# Patient Record
Sex: Female | Born: 1945 | Race: White | Hispanic: No | Marital: Married | Smoking: Never smoker
Health system: Southern US, Community
[De-identification: ages and names within clinical notes are randomized; demographics above are authoritative.]

## PROBLEM LIST (undated history)

## (undated) DIAGNOSIS — J329 Chronic sinusitis, unspecified: Secondary | ICD-10-CM

## (undated) DIAGNOSIS — I10 Essential (primary) hypertension: Secondary | ICD-10-CM

## (undated) DIAGNOSIS — R42 Dizziness and giddiness: Secondary | ICD-10-CM

## (undated) DIAGNOSIS — E039 Hypothyroidism, unspecified: Secondary | ICD-10-CM

## (undated) DIAGNOSIS — G20A1 Parkinson's disease without dyskinesia, without mention of fluctuations: Secondary | ICD-10-CM

## (undated) DIAGNOSIS — G20 Parkinson's disease: Secondary | ICD-10-CM

## (undated) HISTORY — DX: Dizziness and giddiness: R42

## (undated) HISTORY — DX: Chronic sinusitis, unspecified: J32.9

---

## 1974-02-17 HISTORY — PX: THYROIDECTOMY: SHX17

## 1986-02-17 HISTORY — PX: TUBAL LIGATION: SHX77

## 2003-02-18 HISTORY — PX: GALLBLADDER SURGERY: SHX652

## 2003-07-23 ENCOUNTER — Other Ambulatory Visit: Payer: Self-pay

## 2005-10-01 ENCOUNTER — Ambulatory Visit: Payer: Self-pay | Admitting: Unknown Physician Specialty

## 2006-04-30 ENCOUNTER — Ambulatory Visit: Payer: Self-pay

## 2007-03-20 ENCOUNTER — Ambulatory Visit: Payer: Self-pay | Admitting: Otolaryngology

## 2007-06-10 ENCOUNTER — Emergency Department: Payer: Self-pay | Admitting: Emergency Medicine

## 2007-06-30 ENCOUNTER — Ambulatory Visit: Payer: Self-pay | Admitting: Unknown Physician Specialty

## 2008-07-01 LAB — HM COLONOSCOPY: HM Colonoscopy: NORMAL

## 2008-08-15 ENCOUNTER — Ambulatory Visit: Payer: Self-pay | Admitting: Unknown Physician Specialty

## 2008-10-12 ENCOUNTER — Other Ambulatory Visit: Payer: Self-pay | Admitting: Unknown Physician Specialty

## 2009-03-27 ENCOUNTER — Ambulatory Visit: Payer: Self-pay | Admitting: Internal Medicine

## 2009-09-05 ENCOUNTER — Other Ambulatory Visit: Payer: Self-pay | Admitting: Unknown Physician Specialty

## 2009-10-18 ENCOUNTER — Ambulatory Visit: Payer: Self-pay

## 2010-10-10 ENCOUNTER — Other Ambulatory Visit: Payer: Self-pay

## 2010-12-23 ENCOUNTER — Ambulatory Visit (INDEPENDENT_AMBULATORY_CARE_PROVIDER_SITE_OTHER): Payer: PRIVATE HEALTH INSURANCE | Admitting: Internal Medicine

## 2010-12-23 ENCOUNTER — Encounter: Payer: Self-pay | Admitting: *Deleted

## 2010-12-23 VITALS — BP 122/56 | HR 64 | Temp 98.5°F | Ht 64.0 in | Wt 194.0 lb

## 2010-12-23 DIAGNOSIS — I10 Essential (primary) hypertension: Secondary | ICD-10-CM

## 2010-12-23 DIAGNOSIS — R42 Dizziness and giddiness: Secondary | ICD-10-CM | POA: Insufficient documentation

## 2010-12-23 DIAGNOSIS — J309 Allergic rhinitis, unspecified: Secondary | ICD-10-CM | POA: Insufficient documentation

## 2010-12-23 DIAGNOSIS — R5383 Other fatigue: Secondary | ICD-10-CM

## 2010-12-23 DIAGNOSIS — E039 Hypothyroidism, unspecified: Secondary | ICD-10-CM

## 2010-12-23 DIAGNOSIS — R5381 Other malaise: Secondary | ICD-10-CM

## 2010-12-23 MED ORDER — ONDANSETRON HCL 8 MG PO TABS: 8.0000 mg | ORAL_TABLET | Freq: Three times a day (TID) | ORAL | Status: AC | PRN

## 2010-12-23 MED ORDER — FEXOFENADINE HCL 180 MG PO TABS: 180.0000 mg | ORAL_TABLET | Freq: Every day | ORAL | Status: DC

## 2010-12-23 MED ORDER — MECLIZINE HCL 25 MG PO TABS: 25.0000 mg | ORAL_TABLET | Freq: Three times a day (TID) | ORAL | Status: AC | PRN

## 2010-12-23 NOTE — Progress Notes (Signed)
Subjective:    Patient ID: Christina [name], female    DOB: [birth year], 65 y.o.   MRN: [id number]  HPI 65 year old female presents to establish care. She denies any complaints today, with the exception of mild fatigue. She notes that she sleeps poorly at night, often snoring and waking frequently. She reports feeling tired particularly on days where she gets little sleep. She has never been evaluated for sleep apnea. She denies any weakness, fever, chills, shortness of breath, change in bowel habits, or other complaints.  In regards to her hypertension she notes that she has been on lisinopril hydrochlorothiazide and for quite some time. She denies any problems at this medication. She did not bring a record of her blood pressure today, however reports it has been well controlled.  In regards to her hypothyroidism, she brings records of labs today showing that her recent TSH performed in August 2012 was low consistent with over suppression of TSH.she has not had this rechecked. She reports that she has been on her current Synthroid dose first many years. She denies any constipation, hot or cold intolerance, or other symptoms. She does have hot flashes associated with menopause, which have been present for several years and persist.  She notes previous history of vertigo, for which she occasionally uses meclizine and ondansetron. She has not recently used these medications.  She also notes a history of allergic rhinitis, for which she uses allegra with improvement in symptoms.  Outpatient Encounter Prescriptions as of 12/23/2010  Medication Sig Dispense Refill  . B Complex-C-Folic Acid (MULTIVITAMIN, STRESS FORMULA) tablet Take 1 tablet by mouth daily.        . calcium carbonate (OS-CAL) 600 MG TABS Take 600 mg by mouth daily.        . fexofenadine (ALLEGRA) 180 MG tablet Take 1 tablet (180 mg total) by mouth daily.  30 tablet  2  . levothyroxine (SYNTHROID, LEVOTHROID) 125 MCG tablet Take 125 mcg by  mouth daily.        Marland Kitchen lisinopril-hydrochlorothiazide (PRINZIDE,ZESTORETIC) 10-12.5 MG per tablet Take 1 tablet by mouth daily.        . meclizine (ANTIVERT) 25 MG tablet Take 1 tablet (25 mg total) by mouth 3 (three) times daily as needed for dizziness or nausea.  30 tablet  1  . Omega-3 Fatty Acids (FISH OIL TRIPLE STRENGTH) 1400 MG CAPS Take by mouth daily.        . ondansetron (ZOFRAN) 8 MG tablet Take 1 tablet (8 mg total) by mouth every 8 (eight) hours as needed for nausea.  20 tablet  0    Review of Systems  Constitutional: Positive for fatigue. Negative for fever, chills, appetite change and unexpected weight change.  HENT: Negative for ear pain, congestion, sore throat, trouble swallowing, neck pain, voice change and sinus pressure.   Eyes: Negative for visual disturbance.  Respiratory: Negative for cough, shortness of breath, wheezing and stridor.   Cardiovascular: Negative for chest pain, palpitations and leg swelling.  Gastrointestinal: Negative for nausea, vomiting, abdominal pain, diarrhea, constipation, blood in stool, abdominal distention and anal bleeding.  Genitourinary: Negative for dysuria and flank pain.  Musculoskeletal: Negative for myalgias, arthralgias and gait problem.  Skin: Negative for color change and rash.  Neurological: Negative for dizziness and headaches.  Hematological: Negative for adenopathy. Does not bruise/bleed easily.  Psychiatric/Behavioral: Negative for suicidal ideas, sleep disturbance and dysphoric mood. The patient is not nervous/anxious.    BP 122/56  Pulse 64  Temp(Src) 98.5 F (36.9  C) (Oral)  Ht 5\' 4"  (1.626 m)  Wt 194 lb (87.998 kg)  BMI 33.30 kg/m2  SpO2 97%     Objective:   Physical Exam  Constitutional: She is oriented to person, place, and time. She appears well-developed and well-nourished. No distress.  HENT:  Head: Normocephalic and atraumatic.  Right Ear: External ear normal.  Left Ear: External ear normal.  Nose: Nose  normal.  Mouth/Throat: Oropharynx is clear and moist. No oropharyngeal exudate.  Eyes: Conjunctivae are normal. Pupils are equal, round, and reactive to light. Right eye exhibits no discharge. Left eye exhibits no discharge. No scleral icterus.  Neck: Normal range of motion. Neck supple. Carotid bruit is not present. No tracheal deviation present. No thyromegaly present.  Cardiovascular: Normal rate, regular rhythm, normal heart sounds and intact distal pulses.  Exam reveals no gallop and no friction rub.   No murmur heard. Pulmonary/Chest: Effort normal and breath sounds normal. No respiratory distress. She has no wheezes. She has no rales. She exhibits no tenderness.  Abdominal: Soft. Bowel sounds are normal. She exhibits no distension and no mass. There is no tenderness. There is no rebound and no guarding.  Musculoskeletal: Normal range of motion. She exhibits no edema and no tenderness.  Lymphadenopathy:    She has no cervical adenopathy.  Neurological: She is alert and oriented to person, place, and time. No cranial nerve deficit. She exhibits normal muscle tone. Coordination normal.  Skin: Skin is warm and dry. No rash noted. She is not diaphoretic. No erythema. No pallor.  Psychiatric: She has a normal mood and affect. Her behavior is normal. Judgment and thought content normal.          Assessment & Plan:  1. Fatigue - Mild. Will check TSH and B12. Discussed option of sleep study to assess for OSA as pt notes poor sleep and snoring, however pt would like to defer for now.  Follow up 6 months and prn.  2. Hypothyroidism - Recent TSH was oversuppressed on labs. Will recheck TSH with labs at hospital.  3. Hypertension - BP well controlled today. Recent renal function normal 09/2010. Will repeat CMP and urine microalbumin in 03/2011.  4. Vertigo - Will refill Meclizine and ondansetron for her to use prn episodes of vertigo. Will review records as to previous evaluation and  treatment.  5. Allergic rhinitis - will continue Allegra.

## 2011-01-15 ENCOUNTER — Encounter: Payer: Self-pay | Admitting: Internal Medicine

## 2011-01-16 ENCOUNTER — Other Ambulatory Visit: Payer: Self-pay | Admitting: Internal Medicine

## 2011-01-27 ENCOUNTER — Ambulatory Visit: Payer: Self-pay | Admitting: Internal Medicine

## 2011-02-12 ENCOUNTER — Encounter: Payer: Self-pay | Admitting: Internal Medicine

## 2011-02-12 LAB — HM MAMMOGRAPHY: HM Mammogram: NORMAL

## 2011-06-25 ENCOUNTER — Ambulatory Visit: Payer: PRIVATE HEALTH INSURANCE | Admitting: Internal Medicine

## 2011-07-02 ENCOUNTER — Ambulatory Visit (INDEPENDENT_AMBULATORY_CARE_PROVIDER_SITE_OTHER): Payer: PRIVATE HEALTH INSURANCE | Admitting: Internal Medicine

## 2011-07-02 ENCOUNTER — Encounter: Payer: Self-pay | Admitting: Internal Medicine

## 2011-07-02 VITALS — BP 127/73 | HR 68 | Temp 98.3°F | Resp 16 | Wt 195.5 lb

## 2011-07-02 DIAGNOSIS — I10 Essential (primary) hypertension: Secondary | ICD-10-CM

## 2011-07-02 DIAGNOSIS — E039 Hypothyroidism, unspecified: Secondary | ICD-10-CM | POA: Insufficient documentation

## 2011-07-02 DIAGNOSIS — R5381 Other malaise: Secondary | ICD-10-CM

## 2011-07-02 DIAGNOSIS — D649 Anemia, unspecified: Secondary | ICD-10-CM

## 2011-07-02 DIAGNOSIS — R5383 Other fatigue: Secondary | ICD-10-CM

## 2011-07-02 NOTE — Progress Notes (Signed)
Subjective:    Patient ID: Christina [name], female    DOB: 05/27/1945, 66 y.o.   MRN: [id number]  HPI 67 year old female with history of hypertension and hypothyroidism presents for six-month followup. Her primary concern today is fatigue. She denies any focal symptoms. She denies any shortness of breath, chest pain, change in bowel habits, blood in her stool, black stool. She notes that when she recently tried to give blood she was noted to have hemoglobin of 10. This was repeated in her hemoglobin was 9.8. This was not evaluated further. She is UTD on her colonoscopy.  In regards to her hypertension hypothyroidism, she reports full compliance with her medications. She denies any noted side effects.  Outpatient Prescriptions Prior to Visit  Medication Sig Dispense Refill  . calcium carbonate (OS-CAL) 600 MG TABS Take 600 mg by mouth daily.        . fexofenadine (ALLEGRA) 180 MG tablet Take 1 tablet (180 mg total) by mouth daily.  30 tablet  2  . levothyroxine (SYNTHROID, LEVOTHROID) 125 MCG tablet Take 125 mcg by mouth daily.        Marland Kitchen lisinopril-hydrochlorothiazide (PRINZIDE,ZESTORETIC) 10-12.5 MG per tablet Take 1 tablet by mouth daily.        . meclizine (ANTIVERT) 25 MG tablet Take 1 tablet (25 mg total) by mouth 3 (three) times daily as needed for dizziness or nausea.  30 tablet  1  . Omega-3 Fatty Acids (FISH OIL TRIPLE STRENGTH) 1400 MG CAPS Take by mouth daily.        . B Complex-C-Folic Acid (MULTIVITAMIN, STRESS FORMULA) tablet Take 1 tablet by mouth daily.          Review of Systems  Constitutional: Positive for fatigue. Negative for fever, chills, appetite change and unexpected weight change.  HENT: Negative for ear pain, congestion, sore throat, trouble swallowing, neck pain, voice change and sinus pressure.   Eyes: Negative for visual disturbance.  Respiratory: Negative for cough, shortness of breath, wheezing and stridor.   Cardiovascular: Negative for chest pain, palpitations  and leg swelling.  Gastrointestinal: Negative for nausea, vomiting, abdominal pain, diarrhea, constipation, blood in stool, abdominal distention and anal bleeding.  Genitourinary: Negative for dysuria and flank pain.  Musculoskeletal: Negative for myalgias, arthralgias and gait problem.  Skin: Negative for color change and rash.  Neurological: Negative for dizziness and headaches.  Hematological: Negative for adenopathy. Does not bruise/bleed easily.  Psychiatric/Behavioral: Negative for suicidal ideas, sleep disturbance and dysphoric mood. The patient is not nervous/anxious.    BP 127/73  Pulse 68  Temp(Src) 98.3 F (36.8 C) (Oral)  Resp 16  Wt 195 lb 8 oz (88.678 kg)  SpO2 97%     Objective:   Physical Exam  Constitutional: She is oriented to person, place, and time. She appears well-developed and well-nourished. No distress.  HENT:  Head: Normocephalic and atraumatic.  Right Ear: External ear normal.  Left Ear: External ear normal.  Nose: Nose normal.  Mouth/Throat: Oropharynx is clear and moist. No oropharyngeal exudate.  Eyes: Conjunctivae are normal. Pupils are equal, round, and reactive to light. Right eye exhibits no discharge. Left eye exhibits no discharge. No scleral icterus.  Neck: Normal range of motion. Neck supple. No tracheal deviation present. No thyromegaly present.  Cardiovascular: Normal rate, regular rhythm, normal heart sounds and intact distal pulses.  Exam reveals no gallop and no friction rub.   No murmur heard. Pulmonary/Chest: Effort normal and breath sounds normal. No respiratory distress. She has no wheezes. She  has no rales. She exhibits no tenderness.  Abdominal: Soft. Bowel sounds are normal. She exhibits no distension and no mass. There is no tenderness. There is no guarding.  Musculoskeletal: Normal range of motion. She exhibits no edema and no tenderness.  Lymphadenopathy:    She has no cervical adenopathy.  Neurological: She is alert and  oriented to person, place, and time. No cranial nerve deficit. She exhibits normal muscle tone. Coordination normal.  Skin: Skin is warm and dry. No rash noted. She is not diaphoretic. No erythema. No pallor.  Psychiatric: She has a normal mood and affect. Her behavior is normal. Judgment and thought content normal.          Assessment & Plan:

## 2011-07-02 NOTE — Assessment & Plan Note (Signed)
Based on report of anemia on recent labs, suspect this may be allergy of fatigue. Will check CBC, ferritin, B12, TSH, free T4 with labs tomorrow. Will request records on previous colonoscopy. Followup to be based on lab results.

## 2011-07-02 NOTE — Assessment & Plan Note (Signed)
Noted with blood donation. Will check CBC, ferritin, B12 with labs today.

## 2011-07-02 NOTE — Assessment & Plan Note (Signed)
Blood pressure well-controlled today. Will check renal function with labs. Will continue current medications. Follow up 6 months.

## 2011-07-02 NOTE — Assessment & Plan Note (Addendum)
Having some fatigue. Will check TSH and free T4 with labs today. Continue Levothyroxine.

## 2011-07-03 ENCOUNTER — Other Ambulatory Visit: Payer: Self-pay | Admitting: Internal Medicine

## 2011-07-03 LAB — CBC WITH DIFFERENTIAL/PLATELET
Basophil #: 0.1 10*3/uL (ref 0.0–0.1)
Eosinophil #: 0.1 10*3/uL (ref 0.0–0.7)
Eosinophil %: 1.6 %
HGB: 12.3 g/dL (ref 12.0–16.0)
MCV: 75 fL — ABNORMAL LOW (ref 80–100)
Monocyte #: 0.6 x10 3/mm (ref 0.2–0.9)
Monocyte %: 10.1 %
Neutrophil %: 45.6 %
Platelet: 238 10*3/uL (ref 150–440)
RBC: 5.06 10*6/uL (ref 3.80–5.20)
WBC: 5.8 10*3/uL (ref 3.6–11.0)

## 2011-07-03 LAB — COMPREHENSIVE METABOLIC PANEL
Alkaline Phosphatase: 83 U/L (ref 50–136)
BUN: 16 mg/dL (ref 7–18)
Bilirubin,Total: 0.3 mg/dL (ref 0.2–1.0)
Calcium, Total: 8.8 mg/dL (ref 8.5–10.1)
Chloride: 104 mmol/L (ref 98–107)
EGFR (African American): >60
Osmolality: 281 (ref 275–301)
Potassium: 3.7 mmol/L (ref 3.5–5.1)
SGOT(AST): 28 U/L (ref 15–37)
Sodium: 140 mmol/L (ref 136–145)

## 2011-07-03 LAB — T4, FREE: Free Thyroxine: 1.22 ng/dL (ref 0.76–1.46)

## 2011-07-04 ENCOUNTER — Telehealth: Payer: Self-pay | Admitting: Internal Medicine

## 2011-07-04 MED ORDER — FERROUS SULFATE 324 (65 FE) MG PO TBEC: 1.0000 | DELAYED_RELEASE_TABLET | Freq: Three times a day (TID) | ORAL | Status: DC

## 2011-07-04 NOTE — Telephone Encounter (Signed)
[facility] w/contact name & number to ask patient to call back for JAW advisement regarding lab results/SLS Iron Rx has been sent to pt's pharmacy.

## 2011-07-04 NOTE — Telephone Encounter (Signed)
Labs show iron deficiency anemia.  Pt should start ferrous sulfate 324mg  po tid.  She needs to have stool test for occult blood. She needs 1 month follow up.

## 2011-07-07 NOTE — Telephone Encounter (Signed)
LMOM at work [phone], will attempt to reach tomorrow concerning labs/SLS

## 2011-07-09 NOTE — Telephone Encounter (Signed)
Patient notified and has pick up stool kit.

## 2011-07-09 NOTE — Telephone Encounter (Signed)
Left another message asking patient to return my call.

## 2011-07-10 ENCOUNTER — Encounter: Payer: Self-pay | Admitting: Internal Medicine

## 2011-07-15 ENCOUNTER — Telehealth: Payer: Self-pay | Admitting: Internal Medicine

## 2011-07-15 MED ORDER — LEVOTHYROXINE SODIUM 125 MCG PO TABS: 125.0000 ug | ORAL_TABLET | Freq: Every day | ORAL | Status: DC

## 2011-07-15 NOTE — Telephone Encounter (Signed)
Pt called checking on her rx for levothyvoxine that was to be called into [facility]

## 2011-07-15 NOTE — Telephone Encounter (Signed)
Done

## 2011-07-16 ENCOUNTER — Other Ambulatory Visit: Payer: PRIVATE HEALTH INSURANCE

## 2011-07-16 DIAGNOSIS — D649 Anemia, unspecified: Secondary | ICD-10-CM

## 2011-08-13 ENCOUNTER — Ambulatory Visit: Payer: PRIVATE HEALTH INSURANCE | Admitting: Internal Medicine

## 2011-11-17 ENCOUNTER — Telehealth: Payer: Self-pay | Admitting: Internal Medicine

## 2011-11-17 NOTE — Telephone Encounter (Signed)
Levothyroxine 125 mcg tab 90 tab  Take 1 tablet by mouth daily

## 2011-11-18 ENCOUNTER — Other Ambulatory Visit: Payer: Self-pay | Admitting: Internal Medicine

## 2011-11-19 ENCOUNTER — Other Ambulatory Visit: Payer: Self-pay | Admitting: Internal Medicine

## 2011-11-19 MED ORDER — LEVOTHYROXINE SODIUM 125 MCG PO TABS: 125.0000 ug | ORAL_TABLET | Freq: Every day | ORAL | Status: DC

## 2011-11-19 NOTE — Telephone Encounter (Signed)
rx sent to pharmacy by e-script  

## 2011-11-19 NOTE — Telephone Encounter (Signed)
Pt is needing refill for Levothyroxine. She has no pills left and she uses [facility]

## 2011-11-19 NOTE — Telephone Encounter (Signed)
Fine to refill x6 months 

## 2012-01-05 ENCOUNTER — Encounter: Payer: Self-pay | Admitting: Internal Medicine

## 2012-01-05 ENCOUNTER — Ambulatory Visit (INDEPENDENT_AMBULATORY_CARE_PROVIDER_SITE_OTHER): Payer: PRIVATE HEALTH INSURANCE | Admitting: Internal Medicine

## 2012-01-05 VITALS — BP 142/80 | HR 74 | Temp 98.9°F | Ht 64.0 in | Wt 195.8 lb

## 2012-01-05 DIAGNOSIS — Z1239 Encounter for other screening for malignant neoplasm of breast: Secondary | ICD-10-CM

## 2012-01-05 DIAGNOSIS — D649 Anemia, unspecified: Secondary | ICD-10-CM

## 2012-01-05 DIAGNOSIS — I10 Essential (primary) hypertension: Secondary | ICD-10-CM

## 2012-01-05 DIAGNOSIS — E039 Hypothyroidism, unspecified: Secondary | ICD-10-CM

## 2012-01-05 MED ORDER — HYDROCHLOROTHIAZIDE 12.5 MG PO CAPS: 12.5000 mg | ORAL_CAPSULE | Freq: Every day | ORAL | Status: DC

## 2012-01-05 MED ORDER — LEVOTHYROXINE SODIUM 125 MCG PO TABS: 125.0000 ug | ORAL_TABLET | Freq: Every day | ORAL | Status: DC

## 2012-01-05 NOTE — Progress Notes (Signed)
Subjective:    Patient ID: Christina [name], female    DOB: January 11, 1946, 66 y.o.   MRN: [id number]  HPI 66 year old female with history of hypertension and hypothyroidism presents for followup. She reports that in the interim since her last visit she developed persistent nasal drainage, and this was attributed to possible allergy from lisinopril. The medication was stopped and she was treated with hydrochlorothiazide alone. She reports that blood pressure typically has been between 120 and 130/80-90. She denies any chest pain, palpitations, headache. She reports that recent lab work including renal function was normal. In regards to hypothyroidism, she reports full compliance with Synthroid. She denies any symptoms of fatigue, hot or cold intolerance, constipation. She denies any new concerns today.  She was noted on previous labs to be anemic. 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 She reports that recent hemoglobin was 14. She denies any symptoms of fatigue, headache. She has not had any change in bowel habits.  Outpatient Encounter Prescriptions as of 01/05/2012  Medication Sig Dispense Refill  . B Complex-C-Folic Acid (MULTIVITAMIN, STRESS FORMULA) tablet Take 1 tablet by mouth daily.        . calcium carbonate (OS-CAL) 600 MG TABS Take 600 mg by mouth daily.        . ferrous sulfate 324 (65 FE) MG TBEC Take 1 tablet by mouth 3 (three) times daily.  90 tablet  3  . hydrochlorothiazide (MICROZIDE) 12.5 MG capsule Take 1 capsule (12.5 mg total) by mouth daily.  90 capsule  4  . levothyroxine (SYNTHROID, LEVOTHROID) 125 MCG tablet Take 1 tablet (125 mcg total) by mouth daily.  90 tablet  4  . Multiple Vitamin (MULTIVITAMIN) tablet Take 1 tablet by mouth daily.      . Omega-3 Fatty Acids (FISH OIL TRIPLE STRENGTH) 1400 MG CAPS Take by mouth daily.         BP 142/80  Pulse 74  Temp 98.9 F (37.2 C) (Oral)  Ht 5\' 4"  (1.626 m)  Wt 195 lb 12 oz  (88.792 kg)  BMI 33.60 kg/m2  SpO2 97%  Review of Systems  Constitutional: Negative for fever, chills, appetite change, fatigue and unexpected weight change.  HENT: Negative for ear pain, congestion, sore throat, trouble swallowing, neck pain, voice change and sinus pressure.   Eyes: Negative for visual disturbance.  Respiratory: Negative for cough, shortness of breath, wheezing and stridor.   Cardiovascular: Negative for chest pain, palpitations and leg swelling.  Gastrointestinal: Negative for nausea, vomiting, abdominal pain, diarrhea, constipation, blood in stool, abdominal distention and anal bleeding.  Genitourinary: Negative for dysuria and flank pain.  Musculoskeletal: Negative for myalgias, arthralgias and gait problem.  Skin: Negative for color change and rash.  Neurological: Negative for dizziness and headaches.  Hematological: Negative for adenopathy. Does not bruise/bleed easily.  Psychiatric/Behavioral: Negative for suicidal ideas, sleep disturbance and dysphoric mood. The patient is not nervous/anxious.        Objective:   Physical Exam  Constitutional: She is oriented to person, place, and time. She appears well-developed and well-nourished. No distress.  HENT:  Head: Normocephalic and atraumatic.  Right Ear: External ear normal.  Left Ear: External ear normal.  Nose: Nose normal.  Mouth/Throat: Oropharynx is clear and moist. No oropharyngeal exudate.  Eyes: Conjunctivae normal are normal. Pupils are equal, round, and reactive to light. Right eye exhibits no discharge. Left eye exhibits no discharge. No scleral icterus.  Neck: Normal range  of motion. Neck supple. No tracheal deviation present. No thyromegaly present.  Cardiovascular: Normal rate, regular rhythm, normal heart sounds and intact distal pulses.  Exam reveals no gallop and no friction rub.   No murmur heard. Pulmonary/Chest: Effort normal and breath sounds normal. No respiratory distress. She has no  wheezes. She has no rales. She exhibits no tenderness.  Musculoskeletal: Normal range of motion. She exhibits no edema and no tenderness.  Lymphadenopathy:    She has no cervical adenopathy.  Neurological: She is alert and oriented to person, place, and time. No cranial nerve deficit. She exhibits normal muscle tone. Coordination normal.  Skin: Skin is warm and dry. No rash noted. She is not diaphoretic. No erythema. No pallor.  Psychiatric: She has a normal mood and affect. Her behavior is normal. Judgment and thought content normal.          Assessment & Plan:

## 2012-01-05 NOTE — Assessment & Plan Note (Signed)
Blood pressure slightly elevated today. Patient reports better control at home. Her lisinopril was stopped because of concern about allergy. She is now on hydrochlorothiazide alone. We discussed potentially adding losartan if blood pressure consistently greater than 140/90. Patient will e-mail if blood pressure readings. Will check renal function with labs today. Followup in 6 months or sooner as needed.

## 2012-01-05 NOTE — Assessment & Plan Note (Signed)
Will check TSH with labs today. 

## 2012-01-05 NOTE — Assessment & Plan Note (Signed)
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 Will try to get records on recent CBC.

## 2012-01-06 LAB — COMPREHENSIVE METABOLIC PANEL
AST: 25 U/L (ref 0–37)
Albumin: 3.7 g/dL (ref 3.5–5.2)
BUN: 19 mg/dL (ref 6–23)
Calcium: 9.3 mg/dL (ref 8.4–10.5)
Chloride: 98 mEq/L (ref 96–112)
Glucose, Bld: 103 mg/dL — ABNORMAL HIGH (ref 70–99)
Potassium: 3.9 mEq/L (ref 3.5–5.1)
Total Protein: 7.7 g/dL (ref 6.0–8.3)

## 2012-01-06 LAB — TSH: TSH: 0.2 u[IU]/mL — ABNORMAL LOW (ref 0.35–5.50)

## 2012-01-06 LAB — MICROALBUMIN / CREATININE URINE RATIO: Microalb, Ur: 0.4 mg/dL (ref 0.0–1.9)

## 2012-01-25 LAB — HM MAMMOGRAPHY: HM Mammogram: NORMAL

## 2012-01-28 ENCOUNTER — Ambulatory Visit: Payer: Self-pay | Admitting: Internal Medicine

## 2012-02-10 ENCOUNTER — Encounter: Payer: Self-pay | Admitting: Internal Medicine

## 2012-06-14 ENCOUNTER — Ambulatory Visit: Payer: Self-pay

## 2012-06-24 ENCOUNTER — Other Ambulatory Visit (HOSPITAL_COMMUNITY)
Admission: RE | Admit: 2012-06-24 | Discharge: 2012-06-24 | Disposition: A | Discharge status: Home / Self Care | Payer: 59 | Source: Ambulatory Visit | Attending: Internal Medicine | Admitting: Internal Medicine

## 2012-06-24 ENCOUNTER — Encounter: Payer: Self-pay | Admitting: Internal Medicine

## 2012-06-24 ENCOUNTER — Ambulatory Visit (INDEPENDENT_AMBULATORY_CARE_PROVIDER_SITE_OTHER): Payer: 59 | Admitting: Internal Medicine

## 2012-06-24 VITALS — BP 156/76 | HR 76 | Temp 98.3°F | Ht 64.0 in | Wt 191.0 lb

## 2012-06-24 DIAGNOSIS — I10 Essential (primary) hypertension: Secondary | ICD-10-CM

## 2012-06-24 DIAGNOSIS — J309 Allergic rhinitis, unspecified: Secondary | ICD-10-CM

## 2012-06-24 DIAGNOSIS — Z0000 Encounter for general adult medical examination without abnormal findings: Secondary | ICD-10-CM

## 2012-06-24 DIAGNOSIS — Z1151 Encounter for screening for human papillomavirus (HPV): Secondary | ICD-10-CM | POA: Insufficient documentation

## 2012-06-24 DIAGNOSIS — Z01419 Encounter for gynecological examination (general) (routine) without abnormal findings: Secondary | ICD-10-CM | POA: Insufficient documentation

## 2012-06-24 DIAGNOSIS — E039 Hypothyroidism, unspecified: Secondary | ICD-10-CM

## 2012-06-24 MED ORDER — LEVOTHYROXINE SODIUM 125 MCG PO TABS: 125.0000 ug | ORAL_TABLET | Freq: Every day | ORAL | Status: DC

## 2012-06-24 MED ORDER — CIPROFLOXACIN HCL 500 MG PO TABS: 500.0000 mg | ORAL_TABLET | Freq: Two times a day (BID) | ORAL | Status: DC

## 2012-06-24 MED ORDER — FEXOFENADINE HCL 180 MG PO TABS: 180.0000 mg | ORAL_TABLET | Freq: Every day | ORAL | Status: AC

## 2012-06-24 MED ORDER — HYDROCHLOROTHIAZIDE 12.5 MG PO CAPS: 12.5000 mg | ORAL_CAPSULE | Freq: Every day | ORAL | Status: DC

## 2012-06-24 NOTE — Assessment & Plan Note (Signed)
BP Readings from Last 3 Encounters:  06/24/12 156/76  01/05/12 142/80  07/02/11 127/73   Blood pressure slightly elevated today but has been well-controlled at home and work. Will continue hydrochlorothiazide 12.5 mg daily. We'll check renal function with upcoming fasting labs.

## 2012-06-24 NOTE — Assessment & Plan Note (Signed)
General medical exam including breast and pelvic exam normal today. Pap is pending. Mammogram will be scheduled in December 2014. Colonoscopy is up to date. Immunizations are up-to-date. Encouraged continued efforts at healthy diet and regular physical activity.

## 2012-06-24 NOTE — Assessment & Plan Note (Signed)
Will check TSH with labs today. 

## 2012-06-24 NOTE — Progress Notes (Signed)
Subjective:    Patient ID: Christina [name], female    DOB: 14-Nov-1945, 67 y.o.   MRN: [id number]  HPI 67 year old female with history of hypertension and hypothyroidism presents for annual exam. She recently fell while walking in a parking lot and fractured her right wrist. She is wearing a cast and was told she will be in a cast for over 6 weeks. Pain has been well controlled. Aside from this, she reports she is doing well. She is trying to follow a healthy diet and regular physical activity. She continues to work full-time. She is up-to-date on health maintenance including mammogram and colonoscopy. She is due for Pap smear today. She denies any new concerns.  Outpatient Encounter Prescriptions as of 06/24/2012  Medication Sig Dispense Refill  . B Complex-C-Folic Acid (MULTIVITAMIN, STRESS FORMULA) tablet Take 1 tablet by mouth daily.        . calcium carbonate (OS-CAL) 600 MG TABS Take 600 mg by mouth daily.        . ferrous sulfate 324 (65 FE) MG TBEC Take 1 tablet by mouth 3 (three) times daily.  90 tablet  3  . fexofenadine (ALLEGRA) 180 MG tablet Take 1 tablet (180 mg total) by mouth daily.  90 tablet  4  . hydrochlorothiazide (MICROZIDE) 12.5 MG capsule Take 1 capsule (12.5 mg total) by mouth daily.  90 capsule  4  . levothyroxine (SYNTHROID, LEVOTHROID) 125 MCG tablet Take 1 tablet (125 mcg total) by mouth daily.  90 tablet  4  . Multiple Vitamin (MULTIVITAMIN) tablet Take 1 tablet by mouth daily.      . Omega-3 Fatty Acids (FISH OIL TRIPLE STRENGTH) 1400 MG CAPS Take by mouth daily.        . [DISCONTINUED] fexofenadine (ALLEGRA) 180 MG tablet Take 1 tablet (180 mg total) by mouth daily.  30 tablet  2  . [DISCONTINUED] hydrochlorothiazide (MICROZIDE) 12.5 MG capsule Take 1 capsule (12.5 mg total) by mouth daily.  90 capsule  4  . [DISCONTINUED] levothyroxine (SYNTHROID, LEVOTHROID) 125 MCG tablet Take 1 tablet (125 mcg total) by mouth daily.  90 tablet  4  . ciprofloxacin (CIPRO) 500 MG  tablet Take 1 tablet (500 mg total) by mouth 2 (two) times daily.  14 tablet  0   No facility-administered encounter medications on file as of 06/24/2012.   BP 156/76  Pulse 76  Temp(Src) 98.3 F (36.8 C) (Oral)  Ht 5\' 4"  (1.626 m)  Wt 191 lb (86.637 kg)  BMI 32.77 kg/m2  SpO2 96%  Review of Systems  Constitutional: Negative for fever, chills, appetite change, fatigue and unexpected weight change.  HENT: Negative for ear pain, congestion, sore throat, trouble swallowing, neck pain, voice change and sinus pressure.   Eyes: Negative for visual disturbance.  Respiratory: Negative for cough, shortness of breath, wheezing and stridor.   Cardiovascular: Negative for chest pain, palpitations and leg swelling.  Gastrointestinal: Negative for nausea, vomiting, abdominal pain, diarrhea, constipation, blood in stool, abdominal distention and anal bleeding.  Genitourinary: Negative for dysuria and flank pain.  Musculoskeletal: Positive for myalgias and arthralgias. Negative for gait problem.  Skin: Negative for color change and rash.  Neurological: Negative for dizziness and headaches.  Hematological: Negative for adenopathy. Does not bruise/bleed easily.  Psychiatric/Behavioral: Negative for suicidal ideas, sleep disturbance and dysphoric mood. The patient is not nervous/anxious.        Objective:   Physical Exam  Constitutional: She is oriented to person, place, and time. She appears well-developed and  well-nourished. No distress.  HENT:  Head: Normocephalic and atraumatic.  Right Ear: External ear normal.  Left Ear: External ear normal.  Nose: Nose normal.  Mouth/Throat: Oropharynx is clear and moist. No oropharyngeal exudate.  Eyes: Conjunctivae are normal. Pupils are equal, round, and reactive to light. Right eye exhibits no discharge. Left eye exhibits no discharge. No scleral icterus.  Neck: Normal range of motion. Neck supple. No tracheal deviation present. No thyromegaly present.   Cardiovascular: Normal rate, regular rhythm, normal heart sounds and intact distal pulses.  Exam reveals no gallop and no friction rub.   No murmur heard. Pulmonary/Chest: Effort normal and breath sounds normal. No respiratory distress. She has no wheezes. She has no rales. She exhibits no tenderness.  Abdominal: Soft. Bowel sounds are normal. She exhibits no distension and no mass. There is no tenderness. There is no rebound and no guarding.  Genitourinary: Rectum normal, vagina normal and uterus normal. No breast swelling, tenderness, discharge or bleeding. Pelvic exam was performed with patient supine. There is no rash, tenderness or lesion on the right labia. There is no rash, tenderness or lesion on the left labia. Uterus is not enlarged and not tender. Cervix exhibits no motion tenderness, no discharge and no friability. Right adnexum displays no mass, no tenderness and no fullness. Left adnexum displays no mass, no tenderness and no fullness. No erythema or tenderness around the vagina. No vaginal discharge found.  Musculoskeletal: She exhibits no edema and no tenderness.       Right wrist: She exhibits decreased range of motion (cast in place over recent fracture site).  Lymphadenopathy:    She has no cervical adenopathy.  Neurological: She is alert and oriented to person, place, and time. No cranial nerve deficit. She exhibits normal muscle tone. Coordination normal.  Skin: Skin is warm and dry. No rash noted. She is not diaphoretic. No erythema. No pallor.  Psychiatric: She has a normal mood and affect. Her behavior is normal. Judgment and thought content normal.          Assessment & Plan:

## 2012-06-29 ENCOUNTER — Other Ambulatory Visit (INDEPENDENT_AMBULATORY_CARE_PROVIDER_SITE_OTHER): Payer: 59

## 2012-06-29 DIAGNOSIS — Z0000 Encounter for general adult medical examination without abnormal findings: Secondary | ICD-10-CM

## 2012-06-29 LAB — TSH: TSH: 0.52 u[IU]/mL (ref 0.35–5.50)

## 2012-06-29 LAB — COMPREHENSIVE METABOLIC PANEL
CO2: 27 mEq/L (ref 19–32)
Calcium: 9.3 mg/dL (ref 8.4–10.5)
Chloride: 102 mEq/L (ref 96–112)
Creatinine, Ser: 0.7 mg/dL (ref 0.4–1.2)
GFR: 93.84 mL/min (ref >60.00)
Glucose, Bld: 88 mg/dL (ref 70–99)
Total Bilirubin: 0.4 mg/dL (ref 0.3–1.2)

## 2012-06-29 LAB — CBC WITH DIFFERENTIAL/PLATELET
Basophils Absolute: 0.1 10*3/uL (ref 0.0–0.1)
Eosinophils Absolute: 0.1 10*3/uL (ref 0.0–0.7)
Hemoglobin: 11.3 g/dL — ABNORMAL LOW (ref 12.0–15.0)
Lymphocytes Relative: 38.6 % (ref 12.0–46.0)
MCHC: 32.8 g/dL (ref 30.0–36.0)
Monocytes Relative: 9.7 % (ref 3.0–12.0)
Neutro Abs: 3 10*3/uL (ref 1.4–7.7)
Neutrophils Relative %: 49.3 % (ref 43.0–77.0)
RBC: 4.85 Mil/uL (ref 3.87–5.11)
RDW: 18.1 % — ABNORMAL HIGH (ref 11.5–14.6)

## 2012-06-29 LAB — LIPID PANEL
HDL: 49 mg/dL (ref >39.00)
Triglycerides: 159 mg/dL — ABNORMAL HIGH (ref 0.0–149.0)
VLDL: 31.8 mg/dL (ref 0.0–40.0)

## 2012-07-01 ENCOUNTER — Other Ambulatory Visit: Payer: Self-pay | Admitting: *Deleted

## 2012-07-01 DIAGNOSIS — D509 Iron deficiency anemia, unspecified: Secondary | ICD-10-CM

## 2012-07-08 LAB — HM PAP SMEAR: HM PAP: NEGATIVE

## 2012-07-19 ENCOUNTER — Encounter: Payer: Self-pay | Admitting: Nurse Practitioner

## 2012-08-17 ENCOUNTER — Encounter: Payer: Self-pay | Admitting: Nurse Practitioner

## 2012-09-22 ENCOUNTER — Other Ambulatory Visit: Payer: Self-pay

## 2012-12-23 ENCOUNTER — Other Ambulatory Visit: Payer: Self-pay

## 2012-12-29 ENCOUNTER — Ambulatory Visit: Payer: 59 | Admitting: Internal Medicine

## 2012-12-31 ENCOUNTER — Ambulatory Visit (INDEPENDENT_AMBULATORY_CARE_PROVIDER_SITE_OTHER): Payer: 59 | Admitting: Internal Medicine

## 2012-12-31 ENCOUNTER — Encounter: Payer: Self-pay | Admitting: Internal Medicine

## 2012-12-31 VITALS — BP 150/80 | HR 59 | Temp 97.9°F | Resp 12 | Ht 64.0 in | Wt 194.5 lb

## 2012-12-31 DIAGNOSIS — G250 Essential tremor: Secondary | ICD-10-CM

## 2012-12-31 DIAGNOSIS — I10 Essential (primary) hypertension: Secondary | ICD-10-CM

## 2012-12-31 DIAGNOSIS — Z1239 Encounter for other screening for malignant neoplasm of breast: Secondary | ICD-10-CM | POA: Insufficient documentation

## 2012-12-31 DIAGNOSIS — D649 Anemia, unspecified: Secondary | ICD-10-CM | POA: Insufficient documentation

## 2012-12-31 MED ORDER — LOSARTAN POTASSIUM 25 MG PO TABS: 25.0000 mg | ORAL_TABLET | Freq: Every day | ORAL | Status: DC

## 2012-12-31 NOTE — Assessment & Plan Note (Signed)
Mild anemia noted in the past on labs. We'll plan to repeat CBC and ferritin with labs later next week.

## 2012-12-31 NOTE — Assessment & Plan Note (Signed)
Nearly 6 month history of right hand tremor, most consistent with essential tremor.  We discussed medications that can help with tremor including Neurontin, beta blockers, clonidine. She would prefer not to use Neurontin or clonidine because of side effects. She is bradycardic and likely not tolerate beta-blockade. Will set up urology evaluation. Question if other medications might be helpful in controlling symptoms.

## 2012-12-31 NOTE — Progress Notes (Signed)
Pre visit review using our clinic review tool, if applicable. No additional management support is needed unless otherwise documented below in the visit note. 

## 2012-12-31 NOTE — Patient Instructions (Signed)
Start Losartan 25mg  daily.  Labs next week.   Repeat BP check in 1 month.

## 2012-12-31 NOTE — Progress Notes (Signed)
Subjective:    Patient ID: Christina [name], female    DOB: May 14, 1945, 67 y.o.   MRN: [id number]  HPI 67 year old female with history of hypertension, hypothyroidism presents for followup. Her primary concern today is tremor in her right hand. She reports that ever since she fractured her right wrist in April 2014 she has had shaking of her right hand. She discussed this with her orthopedic surgeon who recommended starting Neurontin. She felt uncomfortable with this because of side effects. She reports that the shaking occurs both at rest and with movement. However, she is able to write without difficulty. The shaking is slightly worse with increased anxiety or increased caffeine intake. She denies numbness in her hand or weakness in her arm. She has no previous history of tremor.  In regards to hypertension, she reports compliance with her medication. She denies any chest pain, headache, palpitations.  Outpatient Encounter Prescriptions as of 12/31/2012  Medication Sig  . B Complex-C-Folic Acid (MULTIVITAMIN, STRESS FORMULA) tablet Take 1 tablet by mouth daily.    . calcium carbonate (OS-CAL) 600 MG TABS Take 600 mg by mouth daily.    . ferrous sulfate 324 (65 FE) MG TBEC Take 1 tablet by mouth 3 (three) times daily.  . fexofenadine (ALLEGRA) 180 MG tablet Take 1 tablet (180 mg total) by mouth daily.  . hydrochlorothiazide (MICROZIDE) 12.5 MG capsule Take 1 capsule (12.5 mg total) by mouth daily.  Marland Kitchen levothyroxine (SYNTHROID, LEVOTHROID) 125 MCG tablet Take 1 tablet (125 mcg total) by mouth daily.  . meclizine (ANTIVERT) 12.5 MG tablet Take 12.5 mg by mouth 3 (three) times daily as needed for dizziness.  . Multiple Vitamin (MULTIVITAMIN) tablet Take 1 tablet by mouth daily.  . Omega-3 Fatty Acids (FISH OIL TRIPLE STRENGTH) 1400 MG CAPS Take by mouth daily.     BP 150/80  Pulse 59  Temp(Src) 97.9 F (36.6 C) (Oral)  Resp 12  Ht 5\' 4"  (1.626 m)  Wt 194 lb 8 oz (88.225 kg)  BMI 33.37 kg/m2   SpO2 99%  Review of Systems  Constitutional: Negative for fever, chills, appetite change, fatigue and unexpected weight change.  HENT: Negative for congestion, ear pain, sinus pressure, sore throat, trouble swallowing and voice change.   Eyes: Negative for visual disturbance.  Respiratory: Negative for cough, shortness of breath, wheezing and stridor.   Cardiovascular: Negative for chest pain, palpitations and leg swelling.  Gastrointestinal: Negative for nausea, vomiting, abdominal pain, diarrhea, constipation, blood in stool, abdominal distention and anal bleeding.  Genitourinary: Negative for dysuria and flank pain.  Musculoskeletal: Negative for arthralgias, gait problem, myalgias and neck pain.  Skin: Negative for color change and rash.  Neurological: Positive for tremors. Negative for dizziness, seizures, weakness, numbness and headaches.  Hematological: Negative for adenopathy. Does not bruise/bleed easily.  Psychiatric/Behavioral: Negative for suicidal ideas, sleep disturbance and dysphoric mood. The patient is not nervous/anxious.        Objective:   Physical Exam  Constitutional: She is oriented to person, place, and time. She appears well-developed and well-nourished. No distress.  HENT:  Head: Normocephalic and atraumatic.  Right Ear: External ear normal.  Left Ear: External ear normal.  Nose: Nose normal.  Mouth/Throat: Oropharynx is clear and moist. No oropharyngeal exudate.  Eyes: Conjunctivae are normal. Pupils are equal, round, and reactive to light. Right eye exhibits no discharge. Left eye exhibits no discharge. No scleral icterus.  Neck: Normal range of motion. Neck supple. No tracheal deviation present. No thyromegaly present.  Cardiovascular:  Normal rate, regular rhythm, normal heart sounds and intact distal pulses.  Exam reveals no gallop and no friction rub.   No murmur heard. Pulmonary/Chest: Effort normal and breath sounds normal. No accessory muscle usage.  Not tachypneic. No respiratory distress. She has no decreased breath sounds. She has no wheezes. She has no rhonchi. She has no rales. She exhibits no tenderness.  Musculoskeletal: Normal range of motion. She exhibits no edema and no tenderness.  Lymphadenopathy:    She has no cervical adenopathy.  Neurological: She is alert and oriented to person, place, and time. She displays tremor (right hand, resting). No cranial nerve deficit. She exhibits normal muscle tone. Coordination normal.  Skin: Skin is warm and dry. No rash noted. She is not diaphoretic. No erythema. No pallor.  Psychiatric: She has a normal mood and affect. Her behavior is normal. Judgment and thought content normal.          Assessment & Plan:

## 2012-12-31 NOTE — Assessment & Plan Note (Signed)
BP Readings from Last 3 Encounters:  12/31/12 150/80  06/24/12 156/76  01/05/12 142/80   Blood pressure continues to be slightly elevated. Will add losartan 25 mg daily. Patient will monitor blood pressure at home and e-mail with readings. Plan to check creatinine and potassium level in one week. Followup in 4 weeks.

## 2013-01-04 ENCOUNTER — Ambulatory Visit: Payer: 59 | Admitting: Internal Medicine

## 2013-01-05 ENCOUNTER — Other Ambulatory Visit: Payer: 59

## 2013-01-05 ENCOUNTER — Ambulatory Visit (INDEPENDENT_AMBULATORY_CARE_PROVIDER_SITE_OTHER): Payer: 59 | Admitting: Neurology

## 2013-01-05 ENCOUNTER — Encounter: Payer: Self-pay | Admitting: Neurology

## 2013-01-05 ENCOUNTER — Ambulatory Visit: Payer: 59 | Admitting: Neurology

## 2013-01-05 VITALS — BP 156/70 | HR 60 | Temp 98.0°F | Resp 12 | Ht 65.0 in | Wt 195.4 lb

## 2013-01-05 DIAGNOSIS — Z5181 Encounter for therapeutic drug level monitoring: Secondary | ICD-10-CM

## 2013-01-05 DIAGNOSIS — E039 Hypothyroidism, unspecified: Secondary | ICD-10-CM

## 2013-01-05 DIAGNOSIS — G20A1 Parkinson's disease without dyskinesia, without mention of fluctuations: Secondary | ICD-10-CM

## 2013-01-05 DIAGNOSIS — I10 Essential (primary) hypertension: Secondary | ICD-10-CM

## 2013-01-05 DIAGNOSIS — G20 Parkinson's disease: Secondary | ICD-10-CM

## 2013-01-05 LAB — BASIC METABOLIC PANEL
BUN: 22 mg/dL (ref 6–23)
Chloride: 105 mEq/L (ref 96–112)
Creatinine, Ser: 0.7 mg/dL (ref 0.4–1.2)
GFR: 84.86 mL/min (ref >60.00)
Glucose, Bld: 104 mg/dL — ABNORMAL HIGH (ref 70–99)
Potassium: 3.6 mEq/L (ref 3.5–5.1)
Sodium: 137 mEq/L (ref 135–145)

## 2013-01-05 LAB — CBC WITH DIFFERENTIAL/PLATELET
Basophils Absolute: 0.1 10*3/uL (ref 0.0–0.1)
Basophils Relative: 0.8 % (ref 0.0–3.0)
Eosinophils Absolute: 0.1 10*3/uL (ref 0.0–0.7)
Eosinophils Relative: 1.5 % (ref 0.0–5.0)
HCT: 39.6 % (ref 36.0–46.0)
Hemoglobin: 13 g/dL (ref 12.0–15.0)
Lymphocytes Relative: 38.2 % (ref 12.0–46.0)
Lymphs Abs: 2.7 10*3/uL (ref 0.7–4.0)
MCHC: 32.9 g/dL (ref 30.0–36.0)
Neutro Abs: 3.6 10*3/uL (ref 1.4–7.7)
Neutrophils Relative %: 52.2 % (ref 43.0–77.0)
RBC: 5.09 Mil/uL (ref 3.87–5.11)
RDW: 15.7 % — ABNORMAL HIGH (ref 11.5–14.6)

## 2013-01-05 LAB — FERRITIN: Ferritin: 7.6 ng/mL — ABNORMAL LOW (ref 10.0–291.0)

## 2013-01-05 MED ORDER — PRAMIPEXOLE DIHYDROCHLORIDE 0.125 MG PO TABS: 0.1250 mg | ORAL_TABLET | Freq: Three times a day (TID) | ORAL | Status: DC

## 2013-01-05 MED ORDER — PRAMIPEXOLE DIHYDROCHLORIDE 0.5 MG PO TABS: 0.5000 mg | ORAL_TABLET | Freq: Three times a day (TID) | ORAL | Status: DC

## 2013-01-05 NOTE — Progress Notes (Signed)
Christina [name] was seen today in the movement disorders clinic for neurologic consultation at the request of Wynona Dove, MD.  The consultation is for the evaluation of tremor and possible ET.  The pt states that she fell in April and fx her wrist and tremor in the R hand started after that.  She states that she fell in the parking lot at work, when she tripped over a rock in April.  After the cast was removed, she noted some tremor and was referred for NCV.  She never had it done but the ortho physician she saw told her it would be better within a year.  The tremor has progressed since April.  It seems worse with fatigue or when she is cold.  She thinks that caffeine may worsen the tremor.  The tremor is most noticeable at rest.  It is worse when she holds the hand straight down by her side, as opposed to elbow flexion.  She was told by her ortho physician that was because the blood flow was different in that position.  She is R hand dominant.  Specific Symptoms:  Tremor: yes Voice: no change Sleep: sleeps well  Vivid Dreams:  no  Acting out dreams:  no Wet Pillows: no Postural symptoms:  no  Falls?  no Bradykinesia symptoms: no bradykinesia noted Loss of smell:  Never had good smell Loss of taste:  no Urinary Incontinence:  no Difficulty Swallowing:  no Handwriting, micrographia: yes (shaky but no migrographia) Trouble with ADL's:  no  Trouble buttoning clothing: no Depression:  no Memory changes:  no Hallucinations:  no  visual distortions: no N/V:  no Lightheaded:  no  Syncope: no Diplopia:  no Dyskinesia:  no  Neuroimaging has not previously been performed.    PREVIOUS MEDICATIONS: none to date  ALLERGIES:   Allergies  Allergen Reactions  . Lisinopril-Hydrochlorothiazide Other (See Comments)    Post nasal drainage  . Penicillins Rash    CURRENT MEDICATIONS:  Current Outpatient Prescriptions on File Prior to Visit  Medication Sig Dispense Refill  . B  Complex-C-Folic Acid (MULTIVITAMIN, STRESS FORMULA) tablet Take 1 tablet by mouth daily.        . calcium carbonate (OS-CAL) 600 MG TABS Take 600 mg by mouth daily.        . ferrous sulfate 324 (65 FE) MG TBEC Take 1 tablet by mouth 3 (three) times daily.  90 tablet  3  . fexofenadine (ALLEGRA) 180 MG tablet Take 1 tablet (180 mg total) by mouth daily.  90 tablet  4  . hydrochlorothiazide (MICROZIDE) 12.5 MG capsule Take 1 capsule (12.5 mg total) by mouth daily.  90 capsule  4  . levothyroxine (SYNTHROID, LEVOTHROID) 125 MCG tablet Take 1 tablet (125 mcg total) by mouth daily.  90 tablet  4  . losartan (COZAAR) 25 MG tablet Take 1 tablet (25 mg total) by mouth daily.  30 tablet  3  . meclizine (ANTIVERT) 12.5 MG tablet Take 12.5 mg by mouth 3 (three) times daily as needed for dizziness.      . Multiple Vitamin (MULTIVITAMIN) tablet Take 1 tablet by mouth daily.      . Omega-3 Fatty Acids (FISH OIL TRIPLE STRENGTH) 1400 MG CAPS Take by mouth daily.         No current facility-administered medications on file prior to visit.    PAST MEDICAL HISTORY:   Past Medical History  Diagnosis Date  . Vertigo   . Sinus infection   .  Thyroid disease   . Diverticula of intestine     PAST SURGICAL HISTORY:   Past Surgical History  Procedure Laterality Date  . Tubal ligation  1988  . Thyroidectomy  1976  . Gallbladder surgery  2005  . Vaginal delivery      SOCIAL HISTORY:   History   Social History  . Marital Status: Married    Spouse Name: N/A    Number of Children: 2  . Years of Education: N/A   Occupational History  . Registration - [employer]    Social History Main Topics  . Smoking status: Never Smoker   . Smokeless tobacco: Never Used  . Alcohol Use: Yes     Comment: occasional  . Drug Use: No  . Sexual Activity: Not on file   Other Topics Concern  . Not on file   Social History Narrative   Regular Exercise -  YES, twice a week   Daily Caffeine Use:  2 cups coffee    Lives with husband in [place]. No pets. Work [employer] in [employer].     FAMILY HISTORY:   Family Status  Relation Status Death Age  . Mother Deceased   . Father Deceased   . Sister Alive   . Brother Alive     Liver problems  . Son Alive   . Daughter Alive     Hypothyroidism    ROS:  A complete 10 system review of systems was obtained and was unremarkable apart from what is mentioned above.  PHYSICAL EXAMINATION:    VITALS:   Filed Vitals:   01/05/13 1019  BP: 156/70  Pulse: 60  Temp: 98 F (36.7 C)  Resp: 12  Height: 5\' 5"  (1.651 m)  Weight: 195 lb 6.4 oz (88.633 kg)    GEN:  The patient appears stated age and is in NAD. HEENT:  Normocephalic, atraumatic.  The mucous membranes are moist. The superficial temporal arteries are without ropiness or tenderness. CV:  RRR Lungs:  CTAB Neck/HEME:  There are no carotid bruits bilaterally.  Neurological examination:  Orientation: The patient is alert and oriented x3. Fund of knowledge is appropriate.  Recent and remote memory are intact.  Attention and concentration are normal.    Able to name objects and repeat phrases. Cranial nerves: There is good facial symmetry. Pupils are equal round and reactive to light bilaterally. Fundoscopic exam reveals clear margins bilaterally. Extraocular muscles are intact. The visual fields are full to confrontational testing. The speech is fluent and clear. Soft palate rises symmetrically and there is no tongue deviation. Hearing is intact to conversational tone. Sensation: Sensation is intact to light and pinprick throughout (facial, trunk, extremities). Vibration is intact at the bilateral big toe. There is no extinction with double simultaneous stimulation. There is no sensory dermatomal level identified. Motor: Strength is 5/5 in the bilateral upper and lower extremities.   Shoulder shrug is equal and symmetric.  There is no pronator drift. Deep tendon reflexes: Deep tendon reflexes are 2/4 at the  bilateral biceps, triceps, brachioradialis, patella and achilles. Plantar responses are downgoing bilaterally.  Movement examination: Tone: There is increased tone in the right upper extremity, and mild increased tone in the left upper extremity with activation procedures only.  Tone in the right lower extremity is mildly increased.  Tone in the left lower extremity is normal.  Abnormal movements: There is a right upper extremity resting tremor that becomes worse with distraction procedures (subtracting, saying months backwards) Coordination:  There  is decremation with RAM's, seen primarily on the right with hand opening and closing, finger taps and alternating supination and pronation of the forearm.  There is mild decrease in rapid alternating movements with heel taps/toe taps on the right. Gait and Station: The patient has no difficulty arising out of a deep-seated chair without the use of the hands. The patient's stride length is normal.  There is decreased arm swing on the right.  The patient has a positive pull test.      LABS:  Lab Results  Component Value Date   TSH 0.52 06/29/2012   No results found for this basename: VITAMINB12     Chemistry      Component Value Date/Time   NA 137 06/29/2012 1402   K 3.7 06/29/2012 1402   CL 102 06/29/2012 1402   CO2 27 06/29/2012 1402   BUN 15 06/29/2012 1402   CREATININE 0.7 06/29/2012 1402      Component Value Date/Time   CALCIUM 9.3 06/29/2012 1402   ALKPHOS 68 06/29/2012 1402   AST 24 06/29/2012 1402   ALT 25 06/29/2012 1402   BILITOT 0.4 06/29/2012 1402       ASSESSMENT/PLAN:  1.  Parkinsonism, most consistent with idiopathic Parkinson's disease.  The patient has tremor, mild bradykinesia, rigidity and mild postural instability.  -We discussed the diagnosis as well as pathophysiology of the disease.  We discussed treatment options as well as prognostic indicators.  Patient education was provided.  -Greater than 50% of the 80 minute visit  was spent in counseling answering questions and talking about what to expect now as well as in the future.  We talked about medication options as well as potential future surgical options.  We talked about safety in the home.  -We decided to add pramipexole, and work our way up to 0.5 mg 3 times per day  Risks, benefits, side effects and alternative therapies were discussed.  The opportunity to ask questions was given and they were answered to the best of my ability.  The patient expressed understanding and willingness to follow the outlined treatment protocols.  -I will refer the patient [facility] for PT. We talked about the importance of cardiovascular exercise in Parkinson's disease.  -I talked to the patient about the exercise program for PD  -I encouraged her to get to resources offered by the initial [facility].  -She will have some labs today and I will see her back in the next few months, sooner should new neurologic issues arise.

## 2013-01-05 NOTE — Patient Instructions (Signed)
1.  We will get labs today 2.  Start mirapex - 0.125 mg three times per day for a week, then 2 tablets three times a day for a week and then fill the 0.5 mg tablet and take that three times per day

## 2013-01-06 ENCOUNTER — Other Ambulatory Visit: Payer: 59

## 2013-01-19 ENCOUNTER — Encounter: Payer: Self-pay | Admitting: Emergency Medicine

## 2013-01-31 ENCOUNTER — Ambulatory Visit (INDEPENDENT_AMBULATORY_CARE_PROVIDER_SITE_OTHER): Payer: 59 | Admitting: Internal Medicine

## 2013-01-31 ENCOUNTER — Ambulatory Visit: Payer: Self-pay | Admitting: Internal Medicine

## 2013-01-31 ENCOUNTER — Encounter: Payer: Self-pay | Admitting: Internal Medicine

## 2013-01-31 VITALS — BP 140/70 | HR 61 | Temp 98.6°F | Wt 194.0 lb

## 2013-01-31 DIAGNOSIS — G20A1 Parkinson's disease without dyskinesia, without mention of fluctuations: Secondary | ICD-10-CM

## 2013-01-31 DIAGNOSIS — I10 Essential (primary) hypertension: Secondary | ICD-10-CM

## 2013-01-31 DIAGNOSIS — G20 Parkinson's disease: Secondary | ICD-10-CM

## 2013-01-31 DIAGNOSIS — G4700 Insomnia, unspecified: Secondary | ICD-10-CM

## 2013-01-31 LAB — HM MAMMOGRAPHY

## 2013-01-31 MED ORDER — LOSARTAN POTASSIUM 50 MG PO TABS: 50.0000 mg | ORAL_TABLET | Freq: Every day | ORAL | Status: DC

## 2013-01-31 MED ORDER — ZOLPIDEM TARTRATE 5 MG PO TABS: 5.0000 mg | ORAL_TABLET | Freq: Every evening | ORAL | Status: DC | PRN

## 2013-01-31 NOTE — Assessment & Plan Note (Signed)
Recent symptoms of insomnia likely exacerbated by increased anxiety related to diagnosis of Parkinson's. Will start Ambien 5 mg as needed for insomnia. Discussed potential side effects of this medication.

## 2013-01-31 NOTE — Progress Notes (Signed)
Pre-visit discussion using our clinic review tool. No additional management support is needed unless otherwise documented below in the visit note.  

## 2013-01-31 NOTE — Assessment & Plan Note (Addendum)
Blood pressure continues to be slightly elevated. Will increase losartan to 50 mg daily. Plan to recheck blood pressure in 4 weeks. Will check Cr and K in 1 week.

## 2013-01-31 NOTE — Assessment & Plan Note (Signed)
Recent diagnosis of Parkinson's. Discussed potential support groups in the community. Offered support today. We'll continue Mirapex. Followup with neurology as scheduled in January.

## 2013-01-31 NOTE — Progress Notes (Signed)
Subjective:    Patient ID: Christina [name], female    DOB: 24-May-1945, 67 y.o.   MRN: [id number]  HPI 67 year old female with history of hypertension and tremor presents for followup. She reports that blood pressure has generally been near 140s over 80s while on losartan. Blood pressure was better controlled on lisinopril. She denies any headache, chest pain. She has been compliant with medication.  She was also recently seen by neurology for right arm tremor. She was diagnosed with Parkinson's disease. She is tearful describing this today. She has not yet told her family. She is planning to wait until after the holiday. She has told her husband. She describes him as supportive. She is planning to retire from her job in March 2015. She is having some difficulty sleeping because of increased anxiety. She has not noted any side effects from the medication that was started by her neurologist, Mirapex.  Outpatient Encounter Prescriptions as of 01/31/2013  Medication Sig  . calcium carbonate (OS-CAL) 600 MG TABS Take 600 mg by mouth daily.    . fexofenadine (ALLEGRA) 180 MG tablet Take 1 tablet (180 mg total) by mouth daily.  . hydrochlorothiazide (MICROZIDE) 12.5 MG capsule Take 1 capsule (12.5 mg total) by mouth daily.  Marland Kitchen levothyroxine (SYNTHROID, LEVOTHROID) 125 MCG tablet Take 1 tablet (125 mcg total) by mouth daily.  Marland Kitchen losartan (COZAAR) 50 MG tablet Take 1 tablet (50 mg total) by mouth daily.  . meclizine (ANTIVERT) 12.5 MG tablet Take 12.5 mg by mouth 3 (three) times daily as needed for dizziness.  . Multiple Vitamin (MULTIVITAMIN) tablet Take 1 tablet by mouth daily.  . Omega-3 Fatty Acids (FISH OIL TRIPLE STRENGTH) 1400 MG CAPS Take by mouth daily.    . pramipexole (MIRAPEX) 0.5 MG tablet Take 1 tablet (0.5 mg total) by mouth 3 (three) times daily.  . [DISCONTINUED] losartan (COZAAR) 25 MG tablet Take 1 tablet (25 mg total) by mouth daily.  . B Complex-C-Folic Acid (MULTIVITAMIN, STRESS  FORMULA) tablet Take 1 tablet by mouth daily.    . ferrous sulfate 324 (65 FE) MG TBEC Take 1 tablet by mouth 3 (three) times daily.   BP 140/70  Pulse 61  Temp(Src) 98.6 F (37 C) (Oral)  Wt 194 lb (87.998 kg)  SpO2 95%  Review of Systems  Constitutional: Negative for fever, chills, appetite change, fatigue and unexpected weight change.  HENT: Negative for congestion, ear pain, sinus pressure, sore throat, trouble swallowing and voice change.   Eyes: Negative for visual disturbance.  Respiratory: Negative for cough, shortness of breath, wheezing and stridor.   Cardiovascular: Negative for chest pain, palpitations and leg swelling.  Gastrointestinal: Negative for nausea, vomiting, abdominal pain, diarrhea, constipation, blood in stool, abdominal distention and anal bleeding.  Genitourinary: Negative for dysuria and flank pain.  Musculoskeletal: Negative for arthralgias, gait problem, myalgias and neck pain.  Skin: Negative for color change and rash.  Neurological: Positive for tremors. Negative for dizziness and headaches.  Hematological: Negative for adenopathy. Does not bruise/bleed easily.  Psychiatric/Behavioral: Positive for sleep disturbance and dysphoric mood. Negative for suicidal ideas. The patient is nervous/anxious.        Objective:   Physical Exam  Constitutional: She is oriented to person, place, and time. She appears well-developed and well-nourished. No distress.  HENT:  Head: Normocephalic and atraumatic.  Right Ear: External ear normal.  Left Ear: External ear normal.  Nose: Nose normal.  Mouth/Throat: Oropharynx is clear and moist. No oropharyngeal exudate.  Eyes: Conjunctivae  are normal. Pupils are equal, round, and reactive to light. Right eye exhibits no discharge. Left eye exhibits no discharge. No scleral icterus.  Neck: Normal range of motion. Neck supple. No tracheal deviation present. No thyromegaly present.  Cardiovascular: Normal rate, regular rhythm,  normal heart sounds and intact distal pulses.  Exam reveals no gallop and no friction rub.   No murmur heard. Pulmonary/Chest: Effort normal and breath sounds normal. No accessory muscle usage. Not tachypneic. No respiratory distress. She has no decreased breath sounds. She has no wheezes. She has no rhonchi. She has no rales. She exhibits no tenderness.  Musculoskeletal: Normal range of motion. She exhibits no edema and no tenderness.  Lymphadenopathy:    She has no cervical adenopathy.  Neurological: She is alert and oriented to person, place, and time. She displays tremor (right hand). No cranial nerve deficit or sensory deficit. She exhibits normal muscle tone. Coordination normal.  Skin: Skin is warm and dry. No rash noted. She is not diaphoretic. No erythema. No pallor.  Psychiatric: Her speech is normal and behavior is normal. Judgment and thought content normal. Her mood appears anxious. She exhibits a depressed mood.          Assessment & Plan:

## 2013-02-02 NOTE — Telephone Encounter (Signed)
Called for [employer]. Left message for pt to call back and schedule lab appointment one week after changing Losartan dose.

## 2013-02-08 ENCOUNTER — Other Ambulatory Visit (INDEPENDENT_AMBULATORY_CARE_PROVIDER_SITE_OTHER): Payer: 59

## 2013-02-08 DIAGNOSIS — I10 Essential (primary) hypertension: Secondary | ICD-10-CM

## 2013-02-08 DIAGNOSIS — D649 Anemia, unspecified: Secondary | ICD-10-CM

## 2013-02-08 LAB — BASIC METABOLIC PANEL
BUN: 16 mg/dL (ref 6–23)
CO2: 27 mEq/L (ref 19–32)
Chloride: 103 mEq/L (ref 96–112)
Creatinine, Ser: 0.7 mg/dL (ref 0.4–1.2)
Potassium: 3.7 mEq/L (ref 3.5–5.1)

## 2013-02-08 LAB — CBC WITH DIFFERENTIAL/PLATELET
Basophils Absolute: 0.1 10*3/uL (ref 0.0–0.1)
Basophils Relative: 1 % (ref 0.0–3.0)
Eosinophils Absolute: 0.1 10*3/uL (ref 0.0–0.7)
Eosinophils Relative: 1.3 % (ref 0.0–5.0)
HCT: 39.4 % (ref 36.0–46.0)
Lymphs Abs: 2.8 10*3/uL (ref 0.7–4.0)
MCHC: 32.6 g/dL (ref 30.0–36.0)
MCV: 78.2 fl (ref 78.0–100.0)
Monocytes Absolute: 0.5 10*3/uL (ref 0.1–1.0)
Neutrophils Relative %: 50 % (ref 43.0–77.0)
Platelets: 240 10*3/uL (ref 150.0–400.0)
RBC: 5.04 Mil/uL (ref 3.87–5.11)
RDW: 16.7 % — ABNORMAL HIGH (ref 11.5–14.6)
WBC: 7 10*3/uL (ref 4.5–10.5)

## 2013-02-09 ENCOUNTER — Encounter: Payer: Self-pay | Admitting: Internal Medicine

## 2013-02-28 ENCOUNTER — Encounter: Payer: Self-pay | Admitting: Internal Medicine

## 2013-03-07 ENCOUNTER — Ambulatory Visit (INDEPENDENT_AMBULATORY_CARE_PROVIDER_SITE_OTHER): Payer: 59 | Admitting: Neurology

## 2013-03-07 ENCOUNTER — Encounter: Payer: Self-pay | Admitting: Neurology

## 2013-03-07 VITALS — BP 160/80 | HR 72 | Resp 18 | Ht 64.0 in | Wt 193.2 lb

## 2013-03-07 DIAGNOSIS — G20 Parkinson's disease: Secondary | ICD-10-CM

## 2013-03-07 DIAGNOSIS — G20A1 Parkinson's disease without dyskinesia, without mention of fluctuations: Secondary | ICD-10-CM

## 2013-03-07 MED ORDER — PRAMIPEXOLE DIHYDROCHLORIDE ER 2.25 MG PO TB24: 2.2500 mg | ORAL_TABLET | Freq: Every day | ORAL | Status: DC

## 2013-03-07 NOTE — Progress Notes (Signed)
Christina [name] was seen today in the movement disorders clinic for neurologic consultation at the request of Wynona Dove, MD.  The consultation is for the evaluation of tremor and possible ET.  The pt states that she fell in April and fx her wrist and tremor in the R hand started after that.  She states that she fell in the parking lot at work, when she tripped over a rock in April.  After the cast was removed, she noted some tremor and was referred for NCV.  She never had it done but the ortho physician she saw told her it would be better within a year.  The tremor has progressed since April.  It seems worse with fatigue or when she is cold.  She thinks that caffeine may worsen the tremor.  The tremor is most noticeable at rest.  It is worse when she holds the hand straight down by her side, as opposed to elbow flexion.  She was told by her ortho physician that was because the blood flow was different in that position.  She is R hand dominant.  03/07/13 update:  The pt is seen back in f/u.  She was dx with PD last visit, in Nov, 2014.  I started her on Mirapex and she is on 0.5 mg tid.  She doesn't think that the medication has helped but her husband thinks that it has.  He notices that although she doesn't swing the R arm, her walking itself is better and more balanced.   She decided to hold off on the PD program at the neurorehab center until she has retired.    Neuroimaging has not previously been performed.    PREVIOUS MEDICATIONS: none to date  ALLERGIES:   Allergies  Allergen Reactions  . Lisinopril-Hydrochlorothiazide Other (See Comments)    Post nasal drainage  . Penicillins Rash    CURRENT MEDICATIONS:  Current Outpatient Prescriptions on File Prior to Visit  Medication Sig Dispense Refill  . B Complex-C-Folic Acid (MULTIVITAMIN, STRESS FORMULA) tablet Take 1 tablet by mouth daily.        . calcium carbonate (OS-CAL) 600 MG TABS Take 600 mg by mouth daily.        .  fexofenadine (ALLEGRA) 180 MG tablet Take 1 tablet (180 mg total) by mouth daily.  90 tablet  4  . hydrochlorothiazide (MICROZIDE) 12.5 MG capsule Take 1 capsule (12.5 mg total) by mouth daily.  90 capsule  4  . levothyroxine (SYNTHROID, LEVOTHROID) 125 MCG tablet Take 1 tablet (125 mcg total) by mouth daily.  90 tablet  4  . losartan (COZAAR) 50 MG tablet Take 1 tablet (50 mg total) by mouth daily.  30 tablet  3  . meclizine (ANTIVERT) 12.5 MG tablet Take 12.5 mg by mouth 3 (three) times daily as needed for dizziness.      . Multiple Vitamin (MULTIVITAMIN) tablet Take 1 tablet by mouth daily.      . Omega-3 Fatty Acids (FISH OIL TRIPLE STRENGTH) 1400 MG CAPS Take by mouth daily.        Marland Kitchen zolpidem (AMBIEN) 5 MG tablet Take 1 tablet (5 mg total) by mouth at bedtime as needed for sleep.  30 tablet  1   No current facility-administered medications on file prior to visit.    PAST MEDICAL HISTORY:   Past Medical History  Diagnosis Date  . Vertigo   . Sinus infection   . Thyroid disease     hypothyroidism  .  Diverticula of intestine     PAST SURGICAL HISTORY:   Past Surgical History  Procedure Laterality Date  . Tubal ligation  1988  . Thyroidectomy  1976  . Gallbladder surgery  2005  . Vaginal delivery      SOCIAL HISTORY:   History   Social History  . Marital Status: Married    Spouse Name: N/A    Number of Children: 2  . Years of Education: N/A   Occupational History  . 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     Social History Main Topics  . Smoking status: Never Smoker   . Smokeless tobacco: Never Used  . Alcohol Use: Yes     Comment: occasional  . Drug Use: No  . Sexual Activity: Not on file   Other Topics Concern  . Not on file   Social History Narrative   Regular Exercise -  YES, twice a week   Daily Caffeine Use:  2 cups coffee   Lives with husband in [place]. No pets. Work [employer] in [employer].     FAMILY HISTORY:   Family Status  Relation Status Death  Age  . Mother Deceased     lung CA (smoker)  . Father Deceased     complications of agent orange  . Sister Alive     healthy  . Brother Alive     Liver problems due to diesel exposure  . Son Alive   . Daughter Alive     Hypothyroidism    ROS:  A complete 10 system review of systems was obtained and was unremarkable apart from what is mentioned above.  PHYSICAL EXAMINATION:    VITALS:   Filed Vitals:   03/07/13 1415  BP: 160/80  Pulse: 72  Resp: 18  Height: 5\' 4"  (1.626 m)  Weight: 193 lb 4 oz (87.658 kg)    GEN:  The patient appears stated age and is in NAD. HEENT:  Normocephalic, atraumatic.  The mucous membranes are moist. The superficial temporal arteries are without ropiness or tenderness. CV:  RRR Lungs:  CTAB Neck/HEME:  There are no carotid bruits bilaterally.  Neurological examination:  Orientation: The patient is alert and oriented x3. Fund of knowledge is appropriate.  Recent and remote memory are intact.  Attention and concentration are normal.    Able to name objects and repeat phrases. Cranial nerves: There is good facial symmetry. Pupils are equal round and reactive to light bilaterally. Fundoscopic exam reveals clear margins bilaterally. Extraocular muscles are intact. The visual fields are full to confrontational testing. The speech is fluent and clear. Soft palate rises symmetrically and there is no tongue deviation. Hearing is intact to conversational tone. Sensation: Sensation is intact to light and pinprick throughout (facial, trunk, extremities). Vibration is intact at the bilateral big toe. There is no extinction with double simultaneous stimulation. There is no sensory dermatomal level identified. Motor: Strength is 5/5 in the bilateral upper and lower extremities.   Shoulder shrug is equal and symmetric.  There is no pronator drift. Deep tendon reflexes: Deep tendon reflexes are 2/4 at the bilateral biceps, triceps, brachioradialis, patella and  achilles. Plantar responses are downgoing bilaterally.  Movement examination: Tone: There is just mild increased tone in the RUE with activation and normal tone on the L (improvement) Abnormal movements: There is a right upper extremity resting tremor that becomes worse with distraction procedures (subtracting, saying months backwards) Coordination:  There is decremation with RAM's, seen primarily  on the right with hand opening and closing, finger taps and alternating supination and pronation of the forearm.  There is mild decrease in rapid alternating movements with heel taps/toe taps on the right. Gait and Station: The patient has no difficulty arising out of a deep-seated chair without the use of the hands. The patient's stride length is normal.  There is decreased arm swing on the right.  The patient has a negative pull test.      LABS:  Lab Results  Component Value Date   TSH 0.30* 01/05/2013   Lab Results  Component Value Date   VITAMINB12 746 01/05/2013     Chemistry      Component Value Date/Time   NA 138 02/08/2013 1140   K 3.7 02/08/2013 1140   CL 103 02/08/2013 1140   CO2 27 02/08/2013 1140   BUN 16 02/08/2013 1140   CREATININE 0.7 02/08/2013 1140      Component Value Date/Time   CALCIUM 9.2 02/08/2013 1140   ALKPHOS 68 06/29/2012 1402   AST 24 06/29/2012 1402   ALT 25 06/29/2012 1402   BILITOT 0.4 06/29/2012 1402       ASSESSMENT/PLAN:  1.  Parkinsonism, most consistent with idiopathic Parkinson's disease.  The patient has tremor, mild bradykinesia, rigidity and mild postural instability.  -We discussed the diagnosis as well as pathophysiology of the disease.  We discussed treatment options as well as prognostic indicators.  Patient education was provided.  Her husband was not present last visit and came today and asked questions and I answered them to the best of my ability.    -Her mirapex will be increased to 2.25 mg and I will change her to the long acting once a  day formulation.  Risks, benefits, side effects and alternative therapies were discussed.  The opportunity to ask questions was given and they were answered to the best of my ability.  The patient expressed understanding and willingness to follow the outlined treatment protocols.  -I talked with her and her husband again about the importance of exercise.  I will see her back before her [place] vacation in may.

## 2013-03-07 NOTE — Patient Instructions (Signed)
Your Mirapex prescription has been sent to your pharmacy. We will see you back at the end of April. Call with any questions prior to appt.

## 2013-03-09 ENCOUNTER — Encounter: Payer: Self-pay | Admitting: Internal Medicine

## 2013-03-09 ENCOUNTER — Encounter: Payer: Self-pay | Admitting: Neurology

## 2013-03-15 ENCOUNTER — Ambulatory Visit (INDEPENDENT_AMBULATORY_CARE_PROVIDER_SITE_OTHER): Payer: 59 | Admitting: Internal Medicine

## 2013-03-15 ENCOUNTER — Encounter: Payer: Self-pay | Admitting: Internal Medicine

## 2013-03-15 VITALS — BP 138/70 | HR 66 | Temp 98.9°F | Wt 193.0 lb

## 2013-03-15 DIAGNOSIS — Z23 Encounter for immunization: Secondary | ICD-10-CM

## 2013-03-15 DIAGNOSIS — I10 Essential (primary) hypertension: Secondary | ICD-10-CM

## 2013-03-15 DIAGNOSIS — H109 Unspecified conjunctivitis: Secondary | ICD-10-CM

## 2013-03-15 DIAGNOSIS — G4700 Insomnia, unspecified: Secondary | ICD-10-CM

## 2013-03-15 MED ORDER — CIPROFLOXACIN HCL 0.3 % OP SOLN: 1.0000 [drp] | OPHTHALMIC | Status: DC

## 2013-03-15 MED ORDER — LOSARTAN POTASSIUM-HCTZ 50-12.5 MG PO TABS: 1.0000 | ORAL_TABLET | Freq: Every day | ORAL | Status: DC

## 2013-03-15 MED ORDER — ZOLPIDEM TARTRATE 5 MG PO TABS: 5.0000 mg | ORAL_TABLET | Freq: Every evening | ORAL | Status: DC | PRN

## 2013-03-16 DIAGNOSIS — H109 Unspecified conjunctivitis: Secondary | ICD-10-CM | POA: Insufficient documentation

## 2013-03-16 NOTE — Assessment & Plan Note (Signed)
Blood pressure well-controlled with losartan and hydrochlorothiazide. We'll combine pills today. Recent check of renal function and electrolytes was normal. Plan followup in 6 months or sooner as needed.

## 2013-03-16 NOTE — Assessment & Plan Note (Signed)
Symptoms improved with Ambien. Will continue. 

## 2013-03-16 NOTE — Progress Notes (Signed)
Subjective:    Patient ID: Christina [name], female    DOB: 16-Sep-1945, 68 y.o.   MRN: [id number]  HPI 68 year old female with history of Parkinson's disease, hypertension, hypothyroidism presents for followup. At her last visit, her blood pressure was elevated and her dose of losartan was increased. She reports blood pressure has been better controlled. She denies headache, palpitations, chest pain. She is generally feeling well. Her only concern today is redness of both of her eyes which has been present for several days. She occasionally has some clear drainage but no purulent drainage. She denies any visual disturbance, photophobia, eye pain.  At her last visit, we also started Ambien to help with sleep. She reports significant improvement with this. No daytime somnolence noted.  Outpatient Encounter Prescriptions as of 03/15/2013  Medication Sig  . B Complex-C-Folic Acid (MULTIVITAMIN, STRESS FORMULA) tablet Take 1 tablet by mouth daily.    . calcium carbonate (OS-CAL) 600 MG TABS Take 600 mg by mouth daily.    . ferrous sulfate 324 (65 FE) MG TBEC Take 1 tablet by mouth 2 (two) times daily.  . fexofenadine (ALLEGRA) 180 MG tablet Take 1 tablet (180 mg total) by mouth daily.  Marland Kitchen levothyroxine (SYNTHROID, LEVOTHROID) 125 MCG tablet Take 1 tablet (125 mcg total) by mouth daily.  . meclizine (ANTIVERT) 12.5 MG tablet Take 12.5 mg by mouth 3 (three) times daily as needed for dizziness.  . Multiple Vitamin (MULTIVITAMIN) tablet Take 1 tablet by mouth daily.  . Omega-3 Fatty Acids (FISH OIL TRIPLE STRENGTH) 1400 MG CAPS Take by mouth daily.    . Pramipexole Dihydrochloride (MIRAPEX ER) 2.25 MG TB24 Take 2.25 mg by mouth daily.  Marland Kitchen zolpidem (AMBIEN) 5 MG tablet Take 1 tablet (5 mg total) by mouth at bedtime as needed for sleep.  Marland Kitchen losartan-hydrochlorothiazide (HYZAAR) 50-12.5 MG per tablet Take 1 tablet by mouth daily.   BP 138/70  Pulse 66  Temp(Src) 98.9 F (37.2 C) (Oral)  Wt 193 lb (87.544  kg)  SpO2 97%  Review of Systems  Constitutional: Negative for fever, chills, appetite change, fatigue and unexpected weight change.  HENT: Negative for congestion, ear pain, sinus pressure, sore throat, trouble swallowing and voice change.   Eyes: Negative for visual disturbance.  Respiratory: Negative for cough, shortness of breath, wheezing and stridor.   Cardiovascular: Negative for chest pain, palpitations and leg swelling.  Gastrointestinal: Negative for nausea, vomiting, abdominal pain, diarrhea, constipation, blood in stool, abdominal distention and anal bleeding.  Genitourinary: Negative for dysuria and flank pain.  Musculoskeletal: Negative for arthralgias, gait problem, myalgias and neck pain.  Skin: Negative for color change and rash.  Neurological: Negative for dizziness and headaches.  Hematological: Negative for adenopathy. Does not bruise/bleed easily.  Psychiatric/Behavioral: Negative for suicidal ideas, sleep disturbance and dysphoric mood. The patient is not nervous/anxious.        Objective:   Physical Exam  Constitutional: She is oriented to person, place, and time. She appears well-developed and well-nourished. No distress.  HENT:  Head: Normocephalic and atraumatic.  Right Ear: External ear normal.  Left Ear: External ear normal.  Nose: Nose normal.  Mouth/Throat: Oropharynx is clear and moist. No oropharyngeal exudate.  Eyes: Pupils are equal, round, and reactive to light. Right eye exhibits no discharge. Left eye exhibits no discharge. Right conjunctiva is injected. Left conjunctiva is injected. No scleral icterus.  Neck: Normal range of motion. Neck supple. No tracheal deviation present. No thyromegaly present.  Cardiovascular: Normal rate, regular rhythm,  normal heart sounds and intact distal pulses.  Exam reveals no gallop and no friction rub.   No murmur heard. Pulmonary/Chest: Effort normal and breath sounds normal. No accessory muscle usage. Not  tachypneic. No respiratory distress. She has no decreased breath sounds. She has no wheezes. She has no rhonchi. She has no rales. She exhibits no tenderness.  Musculoskeletal: Normal range of motion. She exhibits no edema and no tenderness.  Lymphadenopathy:    She has no cervical adenopathy.  Neurological: She is alert and oriented to person, place, and time. No cranial nerve deficit. She exhibits normal muscle tone. Coordination normal.  Skin: Skin is warm and dry. No rash noted. She is not diaphoretic. No erythema. No pallor.  Psychiatric: She has a normal mood and affect. Her behavior is normal. Judgment and thought content normal.          Assessment & Plan:

## 2013-03-16 NOTE — Assessment & Plan Note (Signed)
Symptoms consistent with viral conjunctivitis. However, instructed patient to monitor and if any symptoms of purulent exudate, she will start Cipro drops as directed. She will also followup with her ophthalmologist if symptoms are persistent.

## 2013-03-24 ENCOUNTER — Telehealth: Payer: Self-pay | Admitting: Internal Medicine

## 2013-03-24 NOTE — Telephone Encounter (Signed)
Relevant patient education assigned to patient using Emmi. ° °

## 2013-03-25 ENCOUNTER — Encounter: Payer: Self-pay | Admitting: Neurology

## 2013-03-25 NOTE — Telephone Encounter (Signed)
Spoke with pharmacist at [facility] and she states the ER is only available in brand name.

## 2013-03-25 NOTE — Telephone Encounter (Signed)
Lesly RubensteinJade, can you check on this.  I feel confident that there is a generic for pramipexole er  But her pharmacy told her there wasn't

## 2013-03-28 MED ORDER — PRAMIPEXOLE DIHYDROCHLORIDE 0.75 MG PO TABS: 0.7500 mg | ORAL_TABLET | Freq: Three times a day (TID) | ORAL | Status: DC

## 2013-03-28 NOTE — Telephone Encounter (Signed)
Christina [name], go ahead and call in generic pramipexole - 0.75 mg three times per day.  #[phone], RF 3

## 2013-03-28 NOTE — Telephone Encounter (Signed)
RX sent to pharmacy and mychart message sent to inform patient.

## 2013-06-01 ENCOUNTER — Telehealth: Payer: Self-pay | Admitting: Internal Medicine

## 2013-06-01 NOTE — Telephone Encounter (Signed)
Spoke with pt, appt scheduled for tomorrow at 1:00 with Dr. Dan HumphreysWalker

## 2013-06-01 NOTE — Telephone Encounter (Signed)
Patient Information:  Caller Name: Harriett Sineancy  Phone: [phone]  Patient: Christina [name], Christina [name]  Gender: Female  DOB: [birth year]  Age: [age] Years  PCP: Ronna PolioWalker, Jennifer (Adults only)  Office Follow Up:  Does the office need to follow up with this patient?: Yes  Instructions For The Office: No opening at office today. Per protocol notify the office per message for work in appt.  PLEASE CONTACT PATIENT.   Care advice provided.  RN Note:  No opening at office today. Per protocol notify the office per message for work in appt.  PLEASE CONTACT PATIENT.   Care advice provided.  Symptoms  Reason For Call & Symptoms: Patient states she has "sinus infection'  . Onset yesterday 05/31/13.  +coughing productive green sputum, +runny nose green.  Unable to sleep due to cough.  Facial pain pressure at eyes and nose.  Temperature 100.0 (o) yesterday but has not taken temperature today.  Reviewed Health History In EMR: Yes  Reviewed Medications In EMR: Yes  Reviewed Allergies In EMR: Yes  Reviewed Surgeries / Procedures: Yes  Date of Onset of Symptoms: 05/31/2013  Treatments Tried: Allegra, Ibuprofen  Treatments Tried Worked: No  Any Fever: Yes  Fever Taken: Tactile  Fever Time Of Reading: 08:40:00  Fever Last Reading: N/A  Guideline(s) Used:  Sinus Pain and Congestion  Disposition Per Guideline:   See Today in Office  Reason For Disposition Reached:   Sinus pain (not just congestion) and fever  Advice Given:  For a Runny Nose With Profuse Discharge:  Nasal mucus and discharge helps to wash viruses and bacteria out of the nose and sinuses.  Blowing the nose is all that is needed.  If the skin around your nostrils gets irritated, apply a tiny amount of petroleum ointment to the nasal openings once or twice a day.  For a Stuffy Nose - Use Nasal Washes:  Introduction: Saline (salt water) nasal irrigation (nasal wash) is an effective and simple home remedy for treating stuffy nose and sinus  congestion. The nose can be irrigated by pouring, spraying, or squirting salt water into the nose and then letting it run back out.  How it Helps: The salt water rinses out excess mucus, washes out any irritants (dust, allergens) that might be present, and moistens the nasal cavity.  Methods: There are several ways to perform nasal irrigation. You can use a saline nasal spray bottle (available over-the-counter), a rubber ear syringe, a medical syringe without the needle, or a Neti Pot.  Pain and Fever Medicines:  For pain or fever relief, take either acetaminophen or ibuprofen.  They are over-the-counter (OTC) drugs that help treat both fever and pain. You can buy them at the drugstore.  Treat fevers above 101 F (38.3 C). The goal of fever therapy is to bring the fever down to a comfortable level. Remember that fever medicine usually lowers fever 2 degrees F (1 - 1 1/2 degrees C).  Acetaminophen (e.g., Tylenol):  Regular Strength Tylenol: Take 650 mg (two 325 mg pills) by mouth every 4-6 hours as needed. Each Regular Strength Tylenol pill has 325 mg of acetaminophen.  Ibuprofen (e.g., Motrin, Advil):  Take 400 mg (two 200 mg pills) by mouth every 6 hours.  Hydration:  Drink plenty of liquids (6-8 glasses of water daily). If the air in your home is dry, use a cool mist humidifier  Call Back If:   Severe pain lasts longer than 2 hours after pain medicine  Fever lasts longer than 3  days  You become worse.  RN Overrode Recommendation:  Make Appointment  No opening at office today. Per protocol notify the office per message for work in appt.  PLEASE CONTACT PATIENT.   Care advice provided.

## 2013-06-02 ENCOUNTER — Ambulatory Visit (INDEPENDENT_AMBULATORY_CARE_PROVIDER_SITE_OTHER): Payer: 59 | Admitting: Internal Medicine

## 2013-06-02 ENCOUNTER — Encounter: Payer: Self-pay | Admitting: Internal Medicine

## 2013-06-02 VITALS — BP 126/60 | HR 65 | Temp 98.6°F | Resp 16 | Wt 194.0 lb

## 2013-06-02 DIAGNOSIS — J0100 Acute maxillary sinusitis, unspecified: Secondary | ICD-10-CM

## 2013-06-02 MED ORDER — AZITHROMYCIN 250 MG PO TABS: ORAL_TABLET | ORAL | Status: DC

## 2013-06-02 MED ORDER — HYDROCOD POLST-CHLORPHEN POLST 10-8 MG/5ML PO LQCR: 5.0000 mL | Freq: Two times a day (BID) | ORAL | Status: DC | PRN

## 2013-06-02 NOTE — Patient Instructions (Signed)

## 2013-06-02 NOTE — Progress Notes (Signed)
Pre visit review using our clinic review tool, if applicable. No additional management support is needed unless otherwise documented below in the visit note. 

## 2013-06-02 NOTE — Progress Notes (Signed)
   Subjective:    Patient ID: Christina [name], female    DOB: 02-Nov-1945, 68 y.o.   MRN: [id number]  HPI 68YO female presents for acute visit.  Tuesday had fever 100.56F, sore throat, congestion. Head pounding. Taking Allegra D with some improvement. Now, feeling sinus pressure. Cough productive of purulent sputum. Has h/o frequent sinus infections.  Review of Systems  Constitutional: Positive for fever and fatigue. Negative for chills and unexpected weight change.  HENT: Positive for congestion, postnasal drip, rhinorrhea, sinus pressure and sore throat. Negative for ear discharge, ear pain, facial swelling, hearing loss, mouth sores, nosebleeds, sneezing, tinnitus, trouble swallowing and voice change.   Eyes: Negative for pain, discharge, redness and visual disturbance.  Respiratory: Positive for cough. Negative for chest tightness, shortness of breath, wheezing and stridor.   Cardiovascular: Negative for chest pain, palpitations and leg swelling.  Musculoskeletal: Negative for arthralgias, myalgias, neck pain and neck stiffness.  Skin: Negative for color change and rash.  Neurological: Positive for headaches. Negative for dizziness, weakness and light-headedness.  Hematological: Negative for adenopathy.       Objective:    BP 126/60  Pulse 65  Temp(Src) 98.6 F (37 C) (Oral)  Resp 16  Wt 194 lb (87.998 kg)  SpO2 94% Physical Exam  Constitutional: She is oriented to person, place, and time. She appears well-developed and well-nourished. No distress.  HENT:  Head: Normocephalic and atraumatic.  Right Ear: External ear normal. A middle ear effusion is present.  Left Ear: External ear normal. A middle ear effusion is present.  Nose: Mucosal edema present. Right sinus exhibits maxillary sinus tenderness. Left sinus exhibits maxillary sinus tenderness.  Mouth/Throat: Oropharynx is clear and moist. No oropharyngeal exudate.  Eyes: Conjunctivae are normal. Pupils are equal, round, and  reactive to light. Right eye exhibits no discharge. Left eye exhibits no discharge. No scleral icterus.  Neck: Normal range of motion. Neck supple. No tracheal deviation present. No thyromegaly present.  Cardiovascular: Normal rate, regular rhythm, normal heart sounds and intact distal pulses.  Exam reveals no gallop and no friction rub.   No murmur heard. Pulmonary/Chest: Effort normal and breath sounds normal. No accessory muscle usage. Not tachypneic. No respiratory distress. She has no decreased breath sounds. She has no wheezes. She has no rhonchi. She has no rales. She exhibits no tenderness.  Musculoskeletal: Normal range of motion. She exhibits no edema and no tenderness.  Lymphadenopathy:    She has no cervical adenopathy.  Neurological: She is alert and oriented to person, place, and time. No cranial nerve deficit. She exhibits normal muscle tone. Coordination normal.  Skin: Skin is warm and dry. No rash noted. She is not diaphoretic. No erythema. No pallor.  Psychiatric: She has a normal mood and affect. Her behavior is normal. Judgment and thought content normal.          Assessment & Plan:   Problem List Items Addressed This Visit   Acute maxillary sinusitis - Primary     Symptoms and exam consistent with acute maxillary sinusitis. Will start azithromycin, as this has worked well for her in the past. Will use prn Tussionex for cough. Encouraged rest and adequate fluid intake. Continue Allegra D. Follow up prn.    Relevant Medications      chlorpheniramine-HYDROcodone (TUSSIONEX) suspension 8-10 mg/475mL      azithromycin (ZITHROMAX) tablet       Return if symptoms worsen or fail to improve.

## 2013-06-02 NOTE — Assessment & Plan Note (Signed)
Symptoms and exam consistent with acute maxillary sinusitis. Will start azithromycin, as this has worked well for her in the past. Will use prn Tussionex for cough. Encouraged rest and adequate fluid intake. Continue Allegra D. Follow up prn.

## 2013-06-06 ENCOUNTER — Encounter: Payer: Self-pay | Admitting: Neurology

## 2013-06-06 ENCOUNTER — Ambulatory Visit (INDEPENDENT_AMBULATORY_CARE_PROVIDER_SITE_OTHER): Payer: 59 | Admitting: Neurology

## 2013-06-06 VITALS — BP 134/68 | HR 64 | Resp 18 | Ht 65.0 in | Wt 192.0 lb

## 2013-06-06 DIAGNOSIS — G20 Parkinson's disease: Secondary | ICD-10-CM

## 2013-06-06 NOTE — Progress Notes (Signed)
Christina [name] was seen today in the movement disorders clinic for neurologic consultation at the request of Wynona DoveWALKER,JENNIFER AZBELL, MD.  The consultation is for the evaluation of tremor and possible ET.  The pt states that she fell in April and fx her wrist and tremor in the R hand started after that.  She states that she fell in the parking lot at work, when she tripped over a rock in April.  After the cast was removed, she noted some tremor and was referred for NCV.  She never had it done but the ortho physician she saw told her it would be better within a year.  The tremor has progressed since April.  It seems worse with fatigue or when she is cold.  She thinks that caffeine may worsen the tremor.  The tremor is most noticeable at rest.  It is worse when she holds the hand straight down by her side, as opposed to elbow flexion.  She was told by her ortho physician that was because the blood flow was different in that position.  She is R hand dominant.  03/07/13 update:  The pt is seen back in f/u.  She was dx with PD last visit, in Nov, 2014.  I started her on Mirapex and she is on 0.5 mg tid.  She doesn't think that the medication has helped but her husband thinks that it has.  He notices that although she doesn't swing the R arm, her walking itself is better and more balanced.   She decided to hold off on the PD program at the neurorehab center until she has retired.    06/06/13 update:  Pt is f/u regarding her PD.  Her mirapex was increased to 0.75 tid last visit.  No falls.  Has been attending the PD exercise classes as well as another exercise class and feels good.  No hallucinations.  Still has some tremor.  Trouble sleeping.  Has to take ambien twice per week.  Has trouble staying asleep.  Retired 2 weeks ago and plans to vacation in [place] in 2 weeks.  She does have acute maxillary sinusitis and is on abx for that now.    Neuroimaging has not previously been performed.    PREVIOUS  MEDICATIONS: none to date  ALLERGIES:   Allergies  Allergen Reactions  . Lisinopril-Hydrochlorothiazide Other (See Comments)    Post nasal drainage  . Penicillins Rash    CURRENT MEDICATIONS:  Current Outpatient Prescriptions on File Prior to Visit  Medication Sig Dispense Refill  . azithromycin (ZITHROMAX Z-PAK) 250 MG tablet Take 2 pills day 1, then 1 pill daily days 2-5  6 each  0  . B Complex-C-Folic Acid (MULTIVITAMIN, STRESS FORMULA) tablet Take 1 tablet by mouth daily.        . calcium carbonate (OS-CAL) 600 MG TABS Take 600 mg by mouth daily.        . chlorpheniramine-HYDROcodone (TUSSIONEX) 10-8 MG/5ML LQCR Take 5 mLs by mouth every 12 (twelve) hours as needed for cough.  140 mL  0  . ferrous sulfate 324 (65 FE) MG TBEC Take 1 tablet by mouth 2 (two) times daily.      . fexofenadine (ALLEGRA) 180 MG tablet Take 1 tablet (180 mg total) by mouth daily.  90 tablet  4  . levothyroxine (SYNTHROID, LEVOTHROID) 125 MCG tablet Take 1 tablet (125 mcg total) by mouth daily.  90 tablet  4  . losartan-hydrochlorothiazide (HYZAAR) 50-12.5 MG per tablet Take 1  tablet by mouth daily.  90 tablet  3  . meclizine (ANTIVERT) 12.5 MG tablet Take 12.5 mg by mouth 3 (three) times daily as needed for dizziness.      . Multiple Vitamin (MULTIVITAMIN) tablet Take 1 tablet by mouth daily.      . Omega-3 Fatty Acids (FISH OIL TRIPLE STRENGTH) 1400 MG CAPS Take by mouth daily.        . pramipexole (MIRAPEX) 0.75 MG tablet Take 1 tablet (0.75 mg total) by mouth 3 (three) times daily.  270 tablet  3  . zolpidem (AMBIEN) 5 MG tablet Take 1 tablet (5 mg total) by mouth at bedtime as needed for sleep.  30 tablet  4   No current facility-administered medications on file prior to visit.    PAST MEDICAL HISTORY:   Past Medical History  Diagnosis Date  . Vertigo   . Sinus infection   . Thyroid disease     hypothyroidism  . Diverticula of intestine     PAST SURGICAL HISTORY:   Past Surgical History    Procedure Laterality Date  . Tubal ligation  1988  . Thyroidectomy  1976  . Gallbladder surgery  2005  . Vaginal delivery      SOCIAL HISTORY:   History   Social History  . Marital Status: Married    Spouse Name: N/A    Number of Children: 2  . Years of Education: N/A   Occupational History  . 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     Social History Main Topics  . Smoking status: Never Smoker   . Smokeless tobacco: Never Used  . Alcohol Use: Yes     Comment: occasional  . Drug Use: No  . Sexual Activity: Not on file   Other Topics Concern  . Not on file   Social History Narrative   Regular Exercise -  YES, twice a week   Daily Caffeine Use:  2 cups coffee   Lives with husband in [place]. No pets. Work [employer] in [employer].     FAMILY HISTORY:   Family Status  Relation Status Death Age  . Mother Deceased     lung CA (smoker)  . Father Deceased     complications of agent orange  . Sister Alive     healthy  . Brother Alive     Liver problems due to diesel exposure  . Son Alive   . Daughter Alive     Hypothyroidism    ROS:  A complete 10 system review of systems was obtained and was unremarkable apart from what is mentioned above.  PHYSICAL EXAMINATION:    VITALS:   Filed Vitals:   06/06/13 0856  BP: 134/68  Pulse: 64  Resp: 18  Height: 5\' 5"  (1.651 m)  Weight: 192 lb (87.091 kg)    GEN:  The patient appears stated age and is in NAD. HEENT:  Normocephalic, atraumatic.  The mucous membranes are moist. The superficial temporal arteries are without ropiness or tenderness. CV:  RRR Lungs:  CTAB Neck/HEME:  There are no carotid bruits bilaterally.  Neurological examination:  Orientation: The patient is alert and oriented x3. Fund of knowledge is appropriate.  Recent and remote memory are intact.  Attention and concentration are normal.    Able to name objects and repeat phrases. Cranial nerves: There is good facial symmetry. Pupils are equal round  and reactive to light bilaterally. Fundoscopic exam reveals clear margins bilaterally. Extraocular muscles  are intact. The visual fields are full to confrontational testing. The speech is fluent and clear. Soft palate rises symmetrically and there is no tongue deviation. Hearing is intact to conversational tone. Sensation: Sensation is intact to light and pinprick throughout (facial, trunk, extremities). Vibration is intact at the bilateral big toe. There is no extinction with double simultaneous stimulation. There is no sensory dermatomal level identified. Motor: Strength is 5/5 in the bilateral upper and lower extremities.   Shoulder shrug is equal and symmetric.  There is no pronator drift. Deep tendon reflexes: Deep tendon reflexes are 2/4 at the bilateral biceps, triceps, brachioradialis, patella and achilles. Plantar responses are downgoing bilaterally.  Movement examination: Tone: There is normal tone bilaterally today (improvement) Abnormal movements: There is a right upper extremity resting tremor that becomes worse with distraction procedures (subtracting, saying months backwards) Coordination:  There is no decremation with RAMs today (improvement) Gait and Station: The patient has no difficulty arising out of a deep-seated chair without the use of the hands. The patient's stride length is normal.  There is decreased arm swing on the right.  The patient has a negative pull test.  There is an increase in the RUE resting tremor with ambulation.  LABS:  Lab Results  Component Value Date   TSH 0.30* 01/05/2013   Lab Results  Component Value Date   VITAMINB12 746 01/05/2013     Chemistry      Component Value Date/Time   NA 138 02/08/2013 1140   K 3.7 02/08/2013 1140   CL 103 02/08/2013 1140   CO2 27 02/08/2013 1140   BUN 16 02/08/2013 1140   CREATININE 0.7 02/08/2013 1140      Component Value Date/Time   CALCIUM 9.2 02/08/2013 1140   ALKPHOS 68 06/29/2012 1402   AST 24 06/29/2012  1402   ALT 25 06/29/2012 1402   BILITOT 0.4 06/29/2012 1402       ASSESSMENT/PLAN:  1.  Idiopathic Parkinson's disease.  The patient has tremor, mild bradykinesia, rigidity and mild postural instability.  -She looks great on mirapex, 0.75 mg tid. Risks, benefits, side effects and alternative therapies were discussed.  The opportunity to ask questions was given and they were answered to the best of my ability.  The patient expressed understanding and willingness to follow the outlined treatment protocols.  -She is doing much better with exercise and I encouraged her to continue. 2.  Insomnia  -We talked about klonopin but she would like to continue with her prn ambien for now. 3.  Return in about 3 months (around 09/05/2013).

## 2013-07-04 ENCOUNTER — Encounter: Payer: Self-pay | Admitting: Neurology

## 2013-07-05 MED ORDER — PRAMIPEXOLE DIHYDROCHLORIDE 0.75 MG PO TABS
0.7500 mg | ORAL_TABLET | Freq: Three times a day (TID) | ORAL | Status: DC
Start: 2013-07-05 — End: 2014-04-13

## 2013-07-08 ENCOUNTER — Encounter: Payer: 59 | Admitting: Internal Medicine

## 2013-07-08 ENCOUNTER — Ambulatory Visit (INDEPENDENT_AMBULATORY_CARE_PROVIDER_SITE_OTHER): Payer: Medicare Other | Admitting: Internal Medicine

## 2013-07-08 ENCOUNTER — Encounter: Payer: Self-pay | Admitting: Internal Medicine

## 2013-07-08 VITALS — BP 122/58 | HR 65 | Temp 98.3°F | Ht 64.1 in | Wt 192.0 lb

## 2013-07-08 DIAGNOSIS — Z0000 Encounter for general adult medical examination without abnormal findings: Secondary | ICD-10-CM

## 2013-07-08 DIAGNOSIS — I10 Essential (primary) hypertension: Secondary | ICD-10-CM

## 2013-07-08 DIAGNOSIS — E039 Hypothyroidism, unspecified: Secondary | ICD-10-CM

## 2013-07-08 DIAGNOSIS — G20A1 Parkinson's disease without dyskinesia, without mention of fluctuations: Secondary | ICD-10-CM

## 2013-07-08 DIAGNOSIS — G20 Parkinson's disease: Secondary | ICD-10-CM | POA: Diagnosis not present

## 2013-07-08 DIAGNOSIS — G4700 Insomnia, unspecified: Secondary | ICD-10-CM | POA: Diagnosis not present

## 2013-07-08 LAB — LIPID PANEL
CHOL/HDL RATIO: 4
Cholesterol: 205 mg/dL — ABNORMAL HIGH (ref 0–200)
HDL: 52.6 mg/dL (ref >39.00)
LDL CALC: 126 mg/dL — AB (ref 0–99)
Triglycerides: 134 mg/dL (ref 0.0–149.0)
VLDL: 26.8 mg/dL (ref 0.0–40.0)

## 2013-07-08 LAB — COMPREHENSIVE METABOLIC PANEL
ALK PHOS: 58 U/L (ref 39–117)
ALT: 25 U/L (ref 0–35)
AST: 30 U/L (ref 0–37)
Albumin: 3.6 g/dL (ref 3.5–5.2)
BUN: 19 mg/dL (ref 6–23)
CO2: 29 mEq/L (ref 19–32)
Calcium: 9.8 mg/dL (ref 8.4–10.5)
Chloride: 101 mEq/L (ref 96–112)
Creatinine, Ser: 0.8 mg/dL (ref 0.4–1.2)
GFR: 74.09 mL/min (ref >60.00)
Glucose, Bld: 88 mg/dL (ref 70–99)
Potassium: 3.7 mEq/L (ref 3.5–5.1)
SODIUM: 138 meq/L (ref 135–145)
TOTAL PROTEIN: 6.8 g/dL (ref 6.0–8.3)
Total Bilirubin: 0.8 mg/dL (ref 0.2–1.2)

## 2013-07-08 LAB — CBC WITH DIFFERENTIAL/PLATELET
BASOS ABS: 0.1 10*3/uL (ref 0.0–0.1)
Basophils Relative: 1 % (ref 0.0–3.0)
Eosinophils Absolute: 0.1 10*3/uL (ref 0.0–0.7)
Eosinophils Relative: 2.2 % (ref 0.0–5.0)
HEMATOCRIT: 45.1 % (ref 36.0–46.0)
Hemoglobin: 15.3 g/dL — ABNORMAL HIGH (ref 12.0–15.0)
Lymphocytes Relative: 29 % (ref 12.0–46.0)
Lymphs Abs: 1.7 10*3/uL (ref 0.7–4.0)
MCHC: 34 g/dL (ref 30.0–36.0)
MCV: 90.2 fl (ref 78.0–100.0)
MONOS PCT: 7.3 % (ref 3.0–12.0)
Monocytes Absolute: 0.4 10*3/uL (ref 0.1–1.0)
Neutro Abs: 3.6 10*3/uL (ref 1.4–7.7)
Neutrophils Relative %: 60.5 % (ref 43.0–77.0)
PLATELETS: 196 10*3/uL (ref 150.0–400.0)
RBC: 5 Mil/uL (ref 3.87–5.11)
RDW: 13.6 % (ref 11.5–15.5)
WBC: 6 10*3/uL (ref 4.0–10.5)

## 2013-07-08 LAB — MICROALBUMIN / CREATININE URINE RATIO
Creatinine,U: 85.6 mg/dL
Microalb Creat Ratio: 0.4 mg/g (ref 0.0–30.0)
Microalb, Ur: 0.3 mg/dL (ref 0.0–1.9)

## 2013-07-08 LAB — TSH: TSH: 1.17 u[IU]/mL (ref 0.35–4.50)

## 2013-07-08 MED ORDER — ZOLPIDEM TARTRATE 5 MG PO TABS: 5.0000 mg | ORAL_TABLET | Freq: Every evening | ORAL | Status: DC | PRN

## 2013-07-08 NOTE — Assessment & Plan Note (Signed)
Symptoms well controlled on current medication. Follow up with Dr. Arbutus Leas as scheduled in 08/2013.

## 2013-07-08 NOTE — Progress Notes (Signed)
Pre visit review using our clinic review tool, if applicable. No additional management support is needed unless otherwise documented below in the visit note. 

## 2013-07-08 NOTE — Patient Instructions (Signed)
We will check labs today.  Continue healthy diet and exercise.  Follow up in 6 months and as needed.

## 2013-07-08 NOTE — Progress Notes (Signed)
The patient is here for annual Medicare Wellness Examination and management of other chronic and acute problems.   The risk factors are reflected in the history.  The roster of all physicians providing medical care to patient - is listed in the Snapshot section of the chart.  Activities of daily living:   The patient is 100% independent in all ADLs: dressing, toileting, feeding as well as independent mobility. Patient lives with husband in single story home. Has both carpeted and hard wood floors.  Home safety :  The patient has smoke detectors in the home.  They wear seatbelts in their car. There are no firearms at home.  There is no violence in the home. They feel safe where they live.  Infectious Risks: There is no risks for hepatitis, STDs or HIV.  There is no  history of blood transfusion.  They have no travel history to infectious disease endemic areas of the world.  Additional Health Care Providers: The patient has seen their dentist in the last six months. Dentist - Dr. Malen GauzeBlankenship They have seen their eye doctor in the last year. Opthalmologist - Dr. Barbra Sarksevesky They deny hearing issues. They have deferred audiologic testing in the last year.   They do not  have excessive sun exposure. Discussed the need for sun protection: hats,long sleeves and use of sunscreen if there is significant sun exposure.  Dermatologist - Dr. Adolphus Birchwoodasher Neurologist - Dr. Arbutus Leasat  Diet: the importance of a healthy diet is discussed. They do have a healthy diet.  The benefits of regular aerobic exercise were discussed. Patient exercises by participating in [facility] and at [employer] [facility], 2-3 times weekly.  Depression screen: there are no signs or vegative symptoms of depression- irritability, change in appetite, anhedonia, sadness/tearfullness.  Cognitive assessment: the patient manages all their financial and personal affairs and is actively engaged. They could relate day,date,year and  events.  HCPOA - none  The following portions of the patient's history were reviewed and updated as appropriate: allergies, current medications, past family history, past medical history,  past surgical history, past social history and problem list.  Visual acuity was not assessed per patient preference as they have regular follow up with their ophthalmologist. Hearing and body mass index were assessed and reviewed.   During the course of the visit the patient was educated and counseled about appropriate screening and preventive services including : fall prevention , diabetes screening, nutrition counseling, colorectal cancer screening, and recommended immunizations.    Parkinsons - Doing very well after recent therapy work on strengthening [facility]. No current tremor.   Notes some aching pain in right middle finger with increased activity.Takes Aleve for this with improvement.  Review of Systems  Constitutional: Negative for fever, chills, appetite change, fatigue and unexpected weight change.  HENT: Negative for congestion, ear pain, sinus pressure, sore throat, trouble swallowing and voice change.   Eyes: Negative for visual disturbance.  Respiratory: Negative for cough, shortness of breath, wheezing and stridor.   Cardiovascular: Negative for chest pain, palpitations and leg swelling.  Gastrointestinal: Negative for nausea, vomiting, abdominal pain, diarrhea, constipation, blood in stool, abdominal distention and anal bleeding.  Genitourinary: Negative for dysuria and flank pain.  Musculoskeletal: Positive for arthralgias (right middle finger). Negative for gait problem, myalgias and neck pain.  Skin: Negative for color change and rash.  Neurological: Negative for dizziness and headaches.  Hematological: Negative for adenopathy. Does not bruise/bleed easily.  Psychiatric/Behavioral: Negative for suicidal ideas, sleep disturbance  and dysphoric mood. The patient is not  nervous/anxious.        Objective:    BP 122/58  Pulse 65  Temp(Src) 98.3 F (36.8 C) (Oral)  Ht 5' 4.1" (1.628 m)  Wt 192 lb (87.091 kg)  BMI 32.86 kg/m2  SpO2 97% Physical Exam  Constitutional: She is oriented to person, place, and time. She appears well-developed and well-nourished. No distress.  HENT:  Head: Normocephalic and atraumatic.  Right Ear: External ear normal.  Left Ear: External ear normal.  Nose: Nose normal.  Mouth/Throat: Oropharynx is clear and moist. No oropharyngeal exudate.  Eyes: Conjunctivae are normal. Pupils are equal, round, and reactive to light. Right eye exhibits no discharge. Left eye exhibits no discharge. No scleral icterus.  Neck: Normal range of motion. Neck supple. No tracheal deviation present. No thyromegaly present.  Cardiovascular: Normal rate, regular rhythm, normal heart sounds and intact distal pulses.  Exam reveals no gallop and no friction rub.   No murmur heard. Pulmonary/Chest: Effort normal and breath sounds normal. No accessory muscle usage. Not tachypneic. No respiratory distress. She has no decreased breath sounds. She has no wheezes. She has no rales. She exhibits no tenderness. Right breast exhibits no inverted nipple, no mass, no nipple discharge, no skin change and no tenderness. Left breast exhibits no inverted nipple, no mass, no nipple discharge, no skin change and no tenderness. Breasts are symmetrical.  Abdominal: Soft. Bowel sounds are normal. She exhibits no distension and no mass. There is no tenderness. There is no rebound and no guarding.  Musculoskeletal: Normal range of motion. She exhibits no edema and no tenderness.  Lymphadenopathy:    She has no cervical adenopathy.  Neurological: She is alert and oriented to person, place, and time. No cranial nerve deficit. She exhibits normal muscle tone. Coordination normal.  Skin: Skin is warm and dry. No rash noted. She is not diaphoretic. No erythema. No pallor.   Psychiatric: She has a normal mood and affect. Her behavior is normal. Judgment and thought content normal.          Assessment & Plan:   Problem List Items Addressed This Visit     Unprioritized   Hypertension      BP Readings from Last 3 Encounters:  07/08/13 122/58  06/06/13 134/68  06/02/13 126/60   BP well controlled on current medications. Will check renal function with labs.    Hypothyroidism     Will check TSH with labs today.    Relevant Orders      TSH   Insomnia     Symptoms well controlled with Ambien. Will continue.    Relevant Medications      zolpidem (AMBIEN)  tablet   Medicare annual wellness visit, subsequent - Primary     General medical exam normal today including breast exam. PAP and pelvic deferred as PAP normal, HPV neg in 06/2013. Mammogram UTD and reviewed. Colonoscopy UTD. Immunizations UTD. Encouraged healthy diet and exercise. Labs today CBC, CMP, lipids, TSH.    Relevant Orders      CBC with Differential      Comprehensive metabolic panel      Lipid panel      Microalbumin / creatinine urine ratio      Vit D  25 hydroxy (rtn osteoporosis monitoring)   PD (Parkinson's disease)     Symptoms well controlled on current medication. Follow up with Dr. Arbutus Leas as scheduled in 08/2013.        Return in  about 6 months (around 01/08/2014) for Recheck.

## 2013-07-08 NOTE — Assessment & Plan Note (Signed)
Will check TSH with labs today. 

## 2013-07-08 NOTE — Assessment & Plan Note (Signed)
BP Readings from Last 3 Encounters:  07/08/13 122/58  06/06/13 134/68  06/02/13 126/60   BP well controlled on current medications. Will check renal function with labs.

## 2013-07-08 NOTE — Assessment & Plan Note (Signed)
Symptoms well controlled with Ambien. Will continue. 

## 2013-07-08 NOTE — Assessment & Plan Note (Signed)
General medical exam normal today including breast exam. PAP and pelvic deferred as PAP normal, HPV neg in 06/2013. Mammogram UTD and reviewed. Colonoscopy UTD. Immunizations UTD. Encouraged healthy diet and exercise. Labs today CBC, CMP, lipids, TSH.

## 2013-07-09 LAB — VITAMIN D 25 HYDROXY (VIT D DEFICIENCY, FRACTURES): Vit D, 25-Hydroxy: 53 ng/mL (ref 30–89)

## 2013-08-05 ENCOUNTER — Other Ambulatory Visit: Payer: Self-pay | Admitting: *Deleted

## 2013-08-05 MED ORDER — LEVOTHYROXINE SODIUM 125 MCG PO TABS: 125.0000 ug | ORAL_TABLET | Freq: Every day | ORAL | Status: DC

## 2013-09-05 ENCOUNTER — Other Ambulatory Visit: Payer: Self-pay | Admitting: *Deleted

## 2013-09-05 ENCOUNTER — Encounter: Payer: Self-pay | Admitting: Internal Medicine

## 2013-09-05 MED ORDER — LOSARTAN POTASSIUM-HCTZ 50-12.5 MG PO TABS: 1.0000 | ORAL_TABLET | Freq: Every day | ORAL | Status: DC

## 2013-09-07 ENCOUNTER — Encounter: Payer: Self-pay | Admitting: Neurology

## 2013-09-07 ENCOUNTER — Ambulatory Visit (INDEPENDENT_AMBULATORY_CARE_PROVIDER_SITE_OTHER): Payer: Medicare Other | Admitting: Neurology

## 2013-09-07 VITALS — BP 138/68 | HR 72 | Resp 16 | Ht 65.0 in | Wt 192.0 lb

## 2013-09-07 DIAGNOSIS — G4700 Insomnia, unspecified: Secondary | ICD-10-CM

## 2013-09-07 DIAGNOSIS — G20 Parkinson's disease: Secondary | ICD-10-CM | POA: Diagnosis not present

## 2013-09-07 DIAGNOSIS — G20A1 Parkinson's disease without dyskinesia, without mention of fluctuations: Secondary | ICD-10-CM

## 2013-09-07 MED ORDER — CLONAZEPAM 0.5 MG PO TABS
0.5000 mg | ORAL_TABLET | Freq: Every day | ORAL | Status: DC
Start: 2013-09-07 — End: 2014-01-16

## 2013-09-07 NOTE — Progress Notes (Signed)
Christina [name] was seen today in the movement disorders clinic for neurologic consultation at the request of Wynona Dove, MD.  The consultation is for the evaluation of tremor and possible ET.  The pt states that she fell in April and fx her wrist and tremor in the R hand started after that.  She states that she fell in the parking lot at work, when she tripped over a rock in April.  After the cast was removed, she noted some tremor and was referred for NCV.  She never had it done but the ortho physician she saw told her it would be better within a year.  The tremor has progressed since April.  It seems worse with fatigue or when she is cold.  She thinks that caffeine may worsen the tremor.  The tremor is most noticeable at rest.  It is worse when she holds the hand straight down by her side, as opposed to elbow flexion.  She was told by her ortho physician that was because the blood flow was different in that position.  She is R hand dominant.  03/07/13 update:  The pt is seen back in f/u.  She was dx with PD last visit, in Nov, 2014.  I started her on Mirapex and she is on 0.5 mg tid.  She doesn't think that the medication has helped but her husband thinks that it has.  He notices that although she doesn't swing the R arm, her walking itself is better and more balanced.   She decided to hold off on the PD program at the neurorehab center until she has retired.    06/06/13 update:  Pt is f/u regarding her PD.  Her mirapex was increased to 0.75 tid last visit.  No falls.  Has been attending the PD exercise classes as well as another exercise class and feels good.  No hallucinations.  Still has some tremor.  Trouble sleeping.  Has to take ambien twice per week.  Has trouble staying asleep.  Retired 2 weeks ago and plans to vacation in [place] in 2 weeks.  She does have acute maxillary sinusitis and is on abx for that now.   09/07/13 update:  Pt is on mirapex 0.75 mg tid. She is noting more  tremor lately.  There are times that she never notices it and times it is annoying.  She also c/o sleep issues and now asks about the klonopin.  She isn't sleeping even with the ambien.  No depression.  She is exercising.   Overall, feels really good. No compulsive behaviors or SE with the mirapex.    Neuroimaging has not previously been performed.    PREVIOUS MEDICATIONS: none to date  ALLERGIES:   Allergies  Allergen Reactions  . Lisinopril-Hydrochlorothiazide Other (See Comments)    Post nasal drainage  . Penicillins Rash    CURRENT MEDICATIONS:  Current Outpatient Prescriptions on File Prior to Visit  Medication Sig Dispense Refill  . B Complex-C-Folic Acid (MULTIVITAMIN, STRESS FORMULA) tablet Take 1 tablet by mouth daily.        . calcium carbonate (OS-CAL) 600 MG TABS Take 600 mg by mouth daily.        . chlorpheniramine-HYDROcodone (TUSSIONEX) 10-8 MG/5ML LQCR Take 5 mLs by mouth every 12 (twelve) hours as needed for cough.  140 mL  0  . ferrous sulfate 324 (65 FE) MG TBEC Take 1 tablet by mouth 2 (two) times daily.      . fexofenadine (  ALLEGRA) 180 MG tablet Take 1 tablet (180 mg total) by mouth daily.  90 tablet  4  . levothyroxine (SYNTHROID, LEVOTHROID) 125 MCG tablet Take 1 tablet (125 mcg total) by mouth daily.  90 tablet  2  . losartan-hydrochlorothiazide (HYZAAR) 50-12.5 MG per tablet Take 1 tablet by mouth daily.  90 tablet  3  . meclizine (ANTIVERT) 12.5 MG tablet Take 12.5 mg by mouth 3 (three) times daily as needed for dizziness.      . Multiple Vitamin (MULTIVITAMIN) tablet Take 1 tablet by mouth daily.      . Omega-3 Fatty Acids (FISH OIL TRIPLE STRENGTH) 1400 MG CAPS Take by mouth daily.        . pramipexole (MIRAPEX) 0.75 MG tablet Take 1 tablet (0.75 mg total) by mouth 3 (three) times daily.  270 tablet  3  . zolpidem (AMBIEN) 5 MG tablet Take 1 tablet (5 mg total) by mouth at bedtime as needed for sleep.  30 tablet  4   No current facility-administered  medications on file prior to visit.    PAST MEDICAL HISTORY:   Past Medical History  Diagnosis Date  . Vertigo   . Sinus infection   . Thyroid disease     hypothyroidism  . Diverticula of intestine     PAST SURGICAL HISTORY:   Past Surgical History  Procedure Laterality Date  . Tubal ligation  1988  . Thyroidectomy  1976  . Gallbladder surgery  2005  . Vaginal delivery      SOCIAL HISTORY:   History   Social History  . Marital Status: Married    Spouse Name: N/A    Number of Children: 2  . Years of Education: N/A   Occupational History  . 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     Social History Main Topics  . Smoking status: Never Smoker   . Smokeless tobacco: Never Used  . Alcohol Use: Yes     Comment: occasional  . Drug Use: No  . Sexual Activity: Not on file   Other Topics Concern  . Not on file   Social History Narrative   Regular Exercise -  YES, twice a week   Daily Caffeine Use:  2 cups coffee   Lives with husband in [place]. No pets. Work [employer] in [employer].     FAMILY HISTORY:   Family Status  Relation Status Death Age  . Mother Deceased     lung CA (smoker)  . Father Deceased     complications of agent orange  . Sister Alive     healthy  . Brother Alive     Liver problems due to diesel exposure  . Son Alive   . Daughter Alive     Hypothyroidism    ROS:  A complete 10 system review of systems was obtained and was unremarkable apart from what is mentioned above.  PHYSICAL EXAMINATION:    VITALS:   Filed Vitals:   09/07/13 0927  BP: 138/68  Pulse: 72  Resp: 16  Height: 5\' 5"  (1.651 m)  Weight: 192 lb (87.091 kg)    GEN:  The patient appears stated age and is in NAD. HEENT:  Normocephalic, atraumatic.  The mucous membranes are moist. The superficial temporal arteries are without ropiness or tenderness. CV:  RRR Lungs:  CTAB Neck/HEME:  There are no carotid bruits bilaterally.  Neurological examination:  Orientation: The  patient is alert and oriented x3. Fund of knowledge  is appropriate.  Recent and remote memory are intact.  Attention and concentration are normal.    Able to name objects and repeat phrases. Cranial nerves: There is good facial symmetry. Pupils are equal round and reactive to light bilaterally. Fundoscopic exam reveals clear margins bilaterally. Extraocular muscles are intact. The visual fields are full to confrontational testing. The speech is fluent and clear. Soft palate rises symmetrically and there is no tongue deviation. Hearing is intact to conversational tone. Sensation: Sensation is intact to light touch throughout. Motor: Strength is 5/5 in the bilateral upper and lower extremities.   Shoulder shrug is equal and symmetric.  There is no pronator drift.  Movement examination: Tone: There is normal tone bilaterally today  Abnormal movements: There is a right upper extremity resting tremor that becomes worse with distraction procedures (subtracting, saying months backwards) Coordination:  There is no decremation with RAMs today  Gait and Station: The patient has no difficulty arising out of a deep-seated chair without the use of the hands. The patient's stride length is normal.    The patient has a negative pull test.  There is an increase in the RUE resting tremor with ambulation.  LABS:  Lab Results  Component Value Date   TSH 1.17 07/08/2013   Lab Results  Component Value Date   VITAMINB12 746 01/05/2013     Chemistry      Component Value Date/Time   NA 138 07/08/2013 0905   K 3.7 07/08/2013 0905   CL 101 07/08/2013 0905   CO2 29 07/08/2013 0905   BUN 19 07/08/2013 0905   CREATININE 0.8 07/08/2013 0905      Component Value Date/Time   CALCIUM 9.8 07/08/2013 0905   ALKPHOS 58 07/08/2013 0905   AST 30 07/08/2013 0905   ALT 25 07/08/2013 0905   BILITOT 0.8 07/08/2013 0905       ASSESSMENT/PLAN:  1.  Idiopathic Parkinson's disease.  The patient has tremor, mild bradykinesia,  rigidity and mild postural instability.  -She looks great on mirapex, 0.75 mg tid.  We talked about going on the Mirapex, as she was better tremor control, but it is really not excited about that given that she has great control over rigidity and bradykinesia.  In addition, I was not convinced that going up on the medication was going to give her further for tremor control.  Ultimately, she decided that she really did not want to go up on the medication and we decided to stay on this dose.  Risks, benefits, side effects and alternative therapies were discussed.  The opportunity to ask questions was given and they were answered to the best of my ability.  The patient expressed understanding and willingness to follow the outlined treatment protocols.  -She is doing much better with exercise and I encouraged her to continue. 2.  Insomnia  -We decided to discontinue the Ambien and start clonazepam 0.5 mg, half to one tablet at night.  Risks, benefits, side effects and alternative therapies were discussed.  The opportunity to ask questions was given and they were answered to the best of my ability.  The patient expressed understanding and willingness to follow the outlined treatment protocols. 3.  Return in about 4 months (around 01/08/2014).  Much greater than 50% of this visit was spent in counseling with the patient  Total face to face time:  30 min

## 2013-09-07 NOTE — Patient Instructions (Signed)
1. Discontinue Ambien. Start Clonazepam 1/2 - 1 tablet at night.

## 2013-12-08 DIAGNOSIS — M9904 Segmental and somatic dysfunction of sacral region: Secondary | ICD-10-CM | POA: Diagnosis not present

## 2013-12-08 DIAGNOSIS — R251 Tremor, unspecified: Secondary | ICD-10-CM | POA: Diagnosis not present

## 2013-12-08 DIAGNOSIS — M9901 Segmental and somatic dysfunction of cervical region: Secondary | ICD-10-CM | POA: Diagnosis not present

## 2013-12-08 DIAGNOSIS — M5442 Lumbago with sciatica, left side: Secondary | ICD-10-CM | POA: Diagnosis not present

## 2013-12-08 DIAGNOSIS — M9903 Segmental and somatic dysfunction of lumbar region: Secondary | ICD-10-CM | POA: Diagnosis not present

## 2013-12-09 DIAGNOSIS — R251 Tremor, unspecified: Secondary | ICD-10-CM | POA: Diagnosis not present

## 2013-12-09 DIAGNOSIS — M9904 Segmental and somatic dysfunction of sacral region: Secondary | ICD-10-CM | POA: Diagnosis not present

## 2013-12-09 DIAGNOSIS — M9901 Segmental and somatic dysfunction of cervical region: Secondary | ICD-10-CM | POA: Diagnosis not present

## 2013-12-09 DIAGNOSIS — M9903 Segmental and somatic dysfunction of lumbar region: Secondary | ICD-10-CM | POA: Diagnosis not present

## 2013-12-09 DIAGNOSIS — M5442 Lumbago with sciatica, left side: Secondary | ICD-10-CM | POA: Diagnosis not present

## 2013-12-12 DIAGNOSIS — M9904 Segmental and somatic dysfunction of sacral region: Secondary | ICD-10-CM | POA: Diagnosis not present

## 2013-12-12 DIAGNOSIS — M5442 Lumbago with sciatica, left side: Secondary | ICD-10-CM | POA: Diagnosis not present

## 2013-12-12 DIAGNOSIS — M9901 Segmental and somatic dysfunction of cervical region: Secondary | ICD-10-CM | POA: Diagnosis not present

## 2013-12-12 DIAGNOSIS — M9903 Segmental and somatic dysfunction of lumbar region: Secondary | ICD-10-CM | POA: Diagnosis not present

## 2013-12-12 DIAGNOSIS — R251 Tremor, unspecified: Secondary | ICD-10-CM | POA: Diagnosis not present

## 2013-12-13 DIAGNOSIS — M9904 Segmental and somatic dysfunction of sacral region: Secondary | ICD-10-CM | POA: Diagnosis not present

## 2013-12-13 DIAGNOSIS — M9901 Segmental and somatic dysfunction of cervical region: Secondary | ICD-10-CM | POA: Diagnosis not present

## 2013-12-13 DIAGNOSIS — R251 Tremor, unspecified: Secondary | ICD-10-CM | POA: Diagnosis not present

## 2013-12-13 DIAGNOSIS — M9903 Segmental and somatic dysfunction of lumbar region: Secondary | ICD-10-CM | POA: Diagnosis not present

## 2013-12-30 DIAGNOSIS — M9901 Segmental and somatic dysfunction of cervical region: Secondary | ICD-10-CM | POA: Diagnosis not present

## 2013-12-30 DIAGNOSIS — R251 Tremor, unspecified: Secondary | ICD-10-CM | POA: Diagnosis not present

## 2014-01-05 DIAGNOSIS — R251 Tremor, unspecified: Secondary | ICD-10-CM | POA: Diagnosis not present

## 2014-01-05 DIAGNOSIS — M9907 Segmental and somatic dysfunction of upper extremity: Secondary | ICD-10-CM | POA: Diagnosis not present

## 2014-01-05 DIAGNOSIS — M9901 Segmental and somatic dysfunction of cervical region: Secondary | ICD-10-CM | POA: Diagnosis not present

## 2014-01-10 DIAGNOSIS — R251 Tremor, unspecified: Secondary | ICD-10-CM | POA: Diagnosis not present

## 2014-01-10 DIAGNOSIS — M9901 Segmental and somatic dysfunction of cervical region: Secondary | ICD-10-CM | POA: Diagnosis not present

## 2014-01-11 ENCOUNTER — Ambulatory Visit: Payer: Medicare Other | Admitting: Internal Medicine

## 2014-01-16 ENCOUNTER — Ambulatory Visit (INDEPENDENT_AMBULATORY_CARE_PROVIDER_SITE_OTHER): Payer: Medicare Other | Admitting: Internal Medicine

## 2014-01-16 ENCOUNTER — Telehealth: Payer: Self-pay | Admitting: Neurology

## 2014-01-16 ENCOUNTER — Encounter: Payer: Self-pay | Admitting: Internal Medicine

## 2014-01-16 VITALS — BP 118/60 | HR 73 | Temp 98.6°F | Ht 64.1 in | Wt 189.5 lb

## 2014-01-16 DIAGNOSIS — Z1239 Encounter for other screening for malignant neoplasm of breast: Secondary | ICD-10-CM

## 2014-01-16 DIAGNOSIS — I10 Essential (primary) hypertension: Secondary | ICD-10-CM | POA: Diagnosis not present

## 2014-01-16 DIAGNOSIS — E038 Other specified hypothyroidism: Secondary | ICD-10-CM

## 2014-01-16 DIAGNOSIS — G4700 Insomnia, unspecified: Secondary | ICD-10-CM

## 2014-01-16 DIAGNOSIS — G20 Parkinson's disease: Secondary | ICD-10-CM | POA: Diagnosis not present

## 2014-01-16 DIAGNOSIS — K5732 Diverticulitis of large intestine without perforation or abscess without bleeding: Secondary | ICD-10-CM

## 2014-01-16 DIAGNOSIS — G20A1 Parkinson's disease without dyskinesia, without mention of fluctuations: Secondary | ICD-10-CM

## 2014-01-16 LAB — COMPREHENSIVE METABOLIC PANEL
ALBUMIN: 3.7 g/dL (ref 3.5–5.2)
ALK PHOS: 66 U/L (ref 39–117)
ALT: 29 U/L (ref 0–35)
AST: 35 U/L (ref 0–37)
BILIRUBIN TOTAL: 0.6 mg/dL (ref 0.2–1.2)
BUN: 16 mg/dL (ref 6–23)
CALCIUM: 9.2 mg/dL (ref 8.4–10.5)
CO2: 26 meq/L (ref 19–32)
CREATININE: 0.8 mg/dL (ref 0.4–1.2)
Chloride: 101 mEq/L (ref 96–112)
GFR: 79.54 mL/min (ref >60.00)
Glucose, Bld: 108 mg/dL — ABNORMAL HIGH (ref 70–99)
Potassium: 4 mEq/L (ref 3.5–5.1)
Sodium: 136 mEq/L (ref 135–145)
Total Protein: 7.1 g/dL (ref 6.0–8.3)

## 2014-01-16 LAB — TSH: TSH: 2.11 u[IU]/mL (ref 0.35–4.50)

## 2014-01-16 MED ORDER — ZOLPIDEM TARTRATE 5 MG PO TABS: 5.0000 mg | ORAL_TABLET | Freq: Every evening | ORAL | Status: DC | PRN

## 2014-01-16 NOTE — Assessment & Plan Note (Signed)
Recent symptoms consistent with diverticulitis. Given that she completed Flagyl at home and symptoms are improving, will monitor for any recurrent symptoms. If recurrent symptoms, would favor getting CT abdomen and starting Cipro and Flagyl.

## 2014-01-16 NOTE — Assessment & Plan Note (Signed)
Encouraged her to follow up with Dr. Arbutus Leasat and discuss her concerns tomorrow. Discussed that her symptoms are most consistent with PD.

## 2014-01-16 NOTE — Assessment & Plan Note (Signed)
BP Readings from Last 3 Encounters:  01/16/14 118/60  09/07/13 138/68  07/08/13 122/58   BP well controlled. Will continue current medications. Renal function with labs today.

## 2014-01-16 NOTE — Progress Notes (Signed)
Pre visit review using our clinic review tool, if applicable. No additional management support is needed unless otherwise documented below in the visit note. 

## 2014-01-16 NOTE — Assessment & Plan Note (Signed)
Will check TSH with labs today. Continue Levothyroxine. 

## 2014-01-16 NOTE — Patient Instructions (Signed)
Labs today.  Follow up in 6 months or sooner as needed. 

## 2014-01-16 NOTE — Telephone Encounter (Signed)
Pt resch appt from 01-17-14 to 03-02-14

## 2014-01-16 NOTE — Progress Notes (Signed)
Subjective:    Patient ID: Christina [name], female    DOB: 1945/12/26, 68 y.o.   MRN: [id number]  HPI 68YO female presents for follow up.  Parkinson's - Recently stopped Clonazepam and Mirapex because of drowsiness. Talked to a friend's daughter who is a PA who told her that she may not have Parkinson's. Planning to talk with Dr. Carles Collet at next visit. She has scheduled a second opinion at [facility]. Would like to have EMG testing, as recommended by her friend the PA.  Over the last couple of weeks, had some right lower abdominal pain. She attributes this to diverticulitis. She started on some Flagyl she had a home and had improvement in symptoms. Now tolerating a full diet. No fever, chills. Mild RLQ abdominal pain improving. No recent diarrhea. No blood in stool.  Review of Systems  Constitutional: Negative for fever, chills, appetite change, fatigue and unexpected weight change.  Eyes: Negative for visual disturbance.  Respiratory: Negative for shortness of breath.   Cardiovascular: Negative for chest pain and leg swelling.  Gastrointestinal: Positive for abdominal pain. Negative for nausea, vomiting, diarrhea, constipation and abdominal distention.  Musculoskeletal: Negative for myalgias and arthralgias.  Skin: Negative for color change and rash.  Neurological: Positive for tremors. Negative for weakness, numbness and headaches.  Hematological: Negative for adenopathy. Does not bruise/bleed easily.  Psychiatric/Behavioral: Negative for dysphoric mood. The patient is not nervous/anxious.        Objective:    BP 118/60 mmHg  Pulse 73  Temp(Src) 98.6 F (37 C) (Oral)  Ht 5' 4.1" (1.628 m)  Wt 189 lb 8 oz (85.957 kg)  BMI 32.43 kg/m2  SpO2 97% Physical Exam  Constitutional: She is oriented to person, place, and time. She appears well-developed and well-nourished. No distress.  HENT:  Head: Normocephalic and atraumatic.  Right Ear: External ear normal.  Left Ear: External ear  normal.  Nose: Nose normal.  Mouth/Throat: Oropharynx is clear and moist. No oropharyngeal exudate.  Eyes: Conjunctivae are normal. Pupils are equal, round, and reactive to light. Right eye exhibits no discharge. Left eye exhibits no discharge. No scleral icterus.  Neck: Normal range of motion. Neck supple. No tracheal deviation present. No thyromegaly present.  Cardiovascular: Normal rate, regular rhythm, normal heart sounds and intact distal pulses.  Exam reveals no gallop and no friction rub.   No murmur heard. Pulmonary/Chest: Effort normal and breath sounds normal. No accessory muscle usage. No tachypnea. No respiratory distress. She has no decreased breath sounds. She has no wheezes. She has no rhonchi. She has no rales. She exhibits no tenderness.  Musculoskeletal: Normal range of motion. She exhibits no edema or tenderness.  Lymphadenopathy:    She has no cervical adenopathy.  Neurological: She is alert and oriented to person, place, and time. She displays tremor (right hand). No cranial nerve deficit or sensory deficit. She exhibits normal muscle tone. Coordination and gait normal.  Skin: Skin is warm and dry. No rash noted. She is not diaphoretic. No erythema. No pallor.  Psychiatric: She has a normal mood and affect. Her behavior is normal. Judgment and thought content normal.          Assessment & Plan:   Problem List Items Addressed This Visit      Unprioritized   Diverticulitis of colon    Recent symptoms consistent with diverticulitis. Given that she completed Flagyl at home and symptoms are improving, will monitor for any recurrent symptoms. If recurrent symptoms, would favor getting CT  abdomen and starting Cipro and Flagyl.    Hypertension - Primary    BP Readings from Last 3 Encounters:  01/16/14 118/60  09/07/13 138/68  07/08/13 122/58   BP well controlled. Will continue current medications. Renal function with labs today.    Relevant Orders      Comp Met  (CMET)   Hypothyroidism    Will check TSH with labs today. Continue Levothyroxine.    Relevant Orders      TSH   Insomnia   Relevant Medications      zolpidem (AMBIEN)  tablet   PD (Parkinson's disease)    Encouraged her to follow up with Dr. Carles Collet and discuss her concerns tomorrow. Discussed that her symptoms are most consistent with PD.    Screening for breast cancer   Relevant Orders      MM Digital Screening       Return in about 6 months (around 07/17/2014) for Wellness Visit.

## 2014-01-17 ENCOUNTER — Ambulatory Visit: Payer: Medicare Other | Admitting: Neurology

## 2014-01-17 DIAGNOSIS — M9901 Segmental and somatic dysfunction of cervical region: Secondary | ICD-10-CM | POA: Diagnosis not present

## 2014-01-17 DIAGNOSIS — M9907 Segmental and somatic dysfunction of upper extremity: Secondary | ICD-10-CM | POA: Diagnosis not present

## 2014-01-17 DIAGNOSIS — R251 Tremor, unspecified: Secondary | ICD-10-CM | POA: Diagnosis not present

## 2014-01-26 DIAGNOSIS — M9907 Segmental and somatic dysfunction of upper extremity: Secondary | ICD-10-CM | POA: Diagnosis not present

## 2014-01-26 DIAGNOSIS — M9901 Segmental and somatic dysfunction of cervical region: Secondary | ICD-10-CM | POA: Diagnosis not present

## 2014-01-26 DIAGNOSIS — R251 Tremor, unspecified: Secondary | ICD-10-CM | POA: Diagnosis not present

## 2014-02-02 DIAGNOSIS — M9907 Segmental and somatic dysfunction of upper extremity: Secondary | ICD-10-CM | POA: Diagnosis not present

## 2014-02-02 DIAGNOSIS — M9901 Segmental and somatic dysfunction of cervical region: Secondary | ICD-10-CM | POA: Diagnosis not present

## 2014-02-02 DIAGNOSIS — R251 Tremor, unspecified: Secondary | ICD-10-CM | POA: Diagnosis not present

## 2014-02-07 DIAGNOSIS — M9901 Segmental and somatic dysfunction of cervical region: Secondary | ICD-10-CM | POA: Diagnosis not present

## 2014-02-07 DIAGNOSIS — M9907 Segmental and somatic dysfunction of upper extremity: Secondary | ICD-10-CM | POA: Diagnosis not present

## 2014-02-07 DIAGNOSIS — R251 Tremor, unspecified: Secondary | ICD-10-CM | POA: Diagnosis not present

## 2014-02-14 DIAGNOSIS — M9901 Segmental and somatic dysfunction of cervical region: Secondary | ICD-10-CM | POA: Diagnosis not present

## 2014-02-14 DIAGNOSIS — M9907 Segmental and somatic dysfunction of upper extremity: Secondary | ICD-10-CM | POA: Diagnosis not present

## 2014-02-14 DIAGNOSIS — R251 Tremor, unspecified: Secondary | ICD-10-CM | POA: Diagnosis not present

## 2014-02-16 DIAGNOSIS — G20 Parkinson's disease: Secondary | ICD-10-CM | POA: Diagnosis not present

## 2014-02-16 DIAGNOSIS — R251 Tremor, unspecified: Secondary | ICD-10-CM | POA: Diagnosis not present

## 2014-02-27 ENCOUNTER — Ambulatory Visit: Payer: Self-pay | Admitting: Internal Medicine

## 2014-02-27 ENCOUNTER — Telehealth: Payer: Self-pay | Admitting: Neurology

## 2014-02-27 DIAGNOSIS — Z1231 Encounter for screening mammogram for malignant neoplasm of breast: Secondary | ICD-10-CM | POA: Diagnosis not present

## 2014-02-27 LAB — HM MAMMOGRAPHY: HM Mammogram: NEGATIVE

## 2014-02-27 NOTE — Telephone Encounter (Signed)
Pt is changing dr and canceled appt for 03-02-14

## 2014-03-02 ENCOUNTER — Ambulatory Visit: Payer: Medicare Other | Admitting: Neurology

## 2014-03-17 DIAGNOSIS — M9901 Segmental and somatic dysfunction of cervical region: Secondary | ICD-10-CM | POA: Diagnosis not present

## 2014-03-17 DIAGNOSIS — M9907 Segmental and somatic dysfunction of upper extremity: Secondary | ICD-10-CM | POA: Diagnosis not present

## 2014-03-17 DIAGNOSIS — M9904 Segmental and somatic dysfunction of sacral region: Secondary | ICD-10-CM | POA: Diagnosis not present

## 2014-03-17 DIAGNOSIS — R251 Tremor, unspecified: Secondary | ICD-10-CM | POA: Diagnosis not present

## 2014-03-17 DIAGNOSIS — M9902 Segmental and somatic dysfunction of thoracic region: Secondary | ICD-10-CM | POA: Diagnosis not present

## 2014-03-24 ENCOUNTER — Telehealth: Payer: Self-pay | Admitting: Internal Medicine

## 2014-03-24 NOTE — Telephone Encounter (Signed)
I would recommend that she is evaluated for possible flu. She will need to go to urgent care or the ER.

## 2014-03-24 NOTE — Telephone Encounter (Signed)
Notified pt. 

## 2014-03-24 NOTE — Telephone Encounter (Signed)
PLEASE NOTE: All timestamps contained within this report are represented as [place] Standard Time. CONFIDENTIALTY NOTICE: This fax transmission is intended only for the addressee. It contains information that is legally privileged, confidential or otherwise protected from use or disclosure. If you are not the intended recipient, you are strictly prohibited from reviewing, disclosing, copying using or disseminating any of this information or taking any action in reliance on or regarding this information. If you have received this fax in error, please notify us immediately by telephone so that we can arrange for its return to us. Phone: [phone], Toll-Free: [phone], Fax: [phone] Page: 1 of 1 Call Id: [id number] [facility] [place] [facility] - [place] TELEPHONE ADVICE RECORD [facility] Patient Name: Christina [name] DOB: [birth year] Initial Comment Caller states she is having hot and cold sweats, might have the flu and head is pounding. Nurse Assessment Nurse: Roderic OvensNorth, RN, Amy Date/Time Lamount Cohen(Eastern Time): 03/24/2014 3:11:23 PM Confirm and document reason for call. If symptomatic, describe symptoms. ---HOT/COLD SWEATS, HEAD IS HURTING. SHE HAS FEVER 99-100 SHE STATES. SHE IS VERY ACHY ALL OVER. SHE STATES HER NECK, HEAD, SHOULDER AND INTO THE BACK . CALLER STATES THAT SINCE THURSDAY SHE STARTED HAVING THESE SYMPTOMS. DRINKING LIQUIDS ONLY. NOT AN APPETITE. Has the patient traveled out of the country within the last 30 days? ---Not Applicable Does the patient require triage? ---Yes Related visit to physician within the last 2 weeks? ---No Does the PT have any chronic conditions? (i.e. diabetes, asthma, etc.) ---Yes List chronic conditions. ---PARKINSON Guidelines Guideline Title Affirmed Question Affirmed Notes Influenza - Seasonal [1] Headache AND [2] stiff neck (can't touch chin to chest) Final Disposition User Go to ED Now [place], RN,  Amy Comments CALLER STATES THAT SHE THINKS IT COULD BE RELATED TO HER MEDICATION. SHE STATES THAT SHE GOT A MEDICATION CHANGE ON HER AMANTADINE THIS WEEK. SHE STATES SHE WAS TO INCREASE THE DOSAGE ON THIS MEDICATION. LOOKED UP WITH MEDICATION WITH DRUGS. COM TO SEE WHAT THE S/ E'S ARE. HEADACHE, TREMORS, CHILLS, ARE S/E'S. SHE IS GOING TO CALL DR. SCOTT AND SEE IF THEY FEEL IT IS RELATED TO THAT OR DOES HAVE NEED TO GET CHECKED OUT FOR THE FLU SYMPTOMS. SHE WILL GO INTO THE ED FOR EVALUATION AS DIRECTED IF DR. SCOTT DOES NOT FEEL THIS IS MEDICATION RELATED. SHE HAS HEADACHE WITH NECK STIFFNESS.

## 2014-03-25 ENCOUNTER — Ambulatory Visit: Payer: Self-pay | Admitting: Physician Assistant

## 2014-03-25 DIAGNOSIS — N390 Urinary tract infection, site not specified: Secondary | ICD-10-CM | POA: Diagnosis not present

## 2014-03-25 DIAGNOSIS — B349 Viral infection, unspecified: Secondary | ICD-10-CM | POA: Diagnosis not present

## 2014-03-25 DIAGNOSIS — Z79899 Other long term (current) drug therapy: Secondary | ICD-10-CM | POA: Diagnosis not present

## 2014-03-25 LAB — URINALYSIS, COMPLETE
BLOOD: NEGATIVE
Glucose,UR: NEGATIVE
Ketone: 15
Nitrite: NEGATIVE
Ph: 5.5 (ref 5.0–8.0)
RBC,UR: NONE SEEN /HPF (ref 0–5)
SPECIFIC GRAVITY: 1.025 (ref 1.000–1.030)

## 2014-03-25 LAB — RAPID INFLUENZA A&B ANTIGENS

## 2014-03-25 LAB — RAPID STREP-A WITH REFLX: Micro Text Report: NEGATIVE

## 2014-03-27 LAB — BETA STREP CULTURE(ARMC)

## 2014-04-05 ENCOUNTER — Telehealth: Payer: Self-pay | Admitting: *Deleted

## 2014-04-05 ENCOUNTER — Other Ambulatory Visit (INDEPENDENT_AMBULATORY_CARE_PROVIDER_SITE_OTHER): Payer: Medicare Other

## 2014-04-05 DIAGNOSIS — Z139 Encounter for screening, unspecified: Secondary | ICD-10-CM

## 2014-04-05 DIAGNOSIS — Z1389 Encounter for screening for other disorder: Secondary | ICD-10-CM | POA: Diagnosis not present

## 2014-04-05 LAB — POCT URINALYSIS DIPSTICK
BILIRUBIN UA: NEGATIVE
GLUCOSE UA: NEGATIVE
KETONES UA: NEGATIVE
Leukocytes, UA: NEGATIVE
Nitrite, UA: NEGATIVE
Protein, UA: NEGATIVE
RBC UA: NEGATIVE
SPEC GRAV UA: 1.02
UROBILINOGEN UA: 0.2
pH, UA: 6.5

## 2014-04-05 NOTE — Telephone Encounter (Signed)
Pt states that she had a UTI and seen by urgent care 11 days ago.  Pt states "they told me to bring you a urine sample to make sure it was cleared"

## 2014-04-05 NOTE — Telephone Encounter (Signed)
Pt left a urine sample?  

## 2014-04-05 NOTE — Telephone Encounter (Signed)
I do not know why she left a urine sample. We will need to call her

## 2014-04-05 NOTE — Telephone Encounter (Signed)
Christina [name], Please run POCT Urinalysis only Thanks

## 2014-04-10 DIAGNOSIS — M5136 Other intervertebral disc degeneration, lumbar region: Secondary | ICD-10-CM | POA: Diagnosis not present

## 2014-04-10 DIAGNOSIS — M9902 Segmental and somatic dysfunction of thoracic region: Secondary | ICD-10-CM | POA: Diagnosis not present

## 2014-04-10 DIAGNOSIS — M5137 Other intervertebral disc degeneration, lumbosacral region: Secondary | ICD-10-CM | POA: Diagnosis not present

## 2014-04-10 DIAGNOSIS — M9901 Segmental and somatic dysfunction of cervical region: Secondary | ICD-10-CM | POA: Diagnosis not present

## 2014-04-10 DIAGNOSIS — M5442 Lumbago with sciatica, left side: Secondary | ICD-10-CM | POA: Diagnosis not present

## 2014-04-11 DIAGNOSIS — M5136 Other intervertebral disc degeneration, lumbar region: Secondary | ICD-10-CM | POA: Diagnosis not present

## 2014-04-11 DIAGNOSIS — M5137 Other intervertebral disc degeneration, lumbosacral region: Secondary | ICD-10-CM | POA: Diagnosis not present

## 2014-04-11 DIAGNOSIS — M9902 Segmental and somatic dysfunction of thoracic region: Secondary | ICD-10-CM | POA: Diagnosis not present

## 2014-04-11 DIAGNOSIS — M5442 Lumbago with sciatica, left side: Secondary | ICD-10-CM | POA: Diagnosis not present

## 2014-04-13 ENCOUNTER — Ambulatory Visit (INDEPENDENT_AMBULATORY_CARE_PROVIDER_SITE_OTHER): Payer: Medicare Other | Admitting: Internal Medicine

## 2014-04-13 ENCOUNTER — Encounter: Payer: Self-pay | Admitting: Internal Medicine

## 2014-04-13 VITALS — BP 138/77 | HR 75 | Temp 97.8°F | Ht 64.1 in | Wt 186.4 lb

## 2014-04-13 DIAGNOSIS — M5432 Sciatica, left side: Secondary | ICD-10-CM | POA: Diagnosis not present

## 2014-04-13 DIAGNOSIS — R319 Hematuria, unspecified: Secondary | ICD-10-CM | POA: Diagnosis not present

## 2014-04-13 DIAGNOSIS — N390 Urinary tract infection, site not specified: Secondary | ICD-10-CM | POA: Diagnosis not present

## 2014-04-13 DIAGNOSIS — M5430 Sciatica, unspecified side: Secondary | ICD-10-CM | POA: Diagnosis not present

## 2014-04-13 LAB — POCT URINALYSIS DIPSTICK
Bilirubin, UA: NEGATIVE
Blood, UA: NEGATIVE
Glucose, UA: NEGATIVE
Nitrite, UA: NEGATIVE
PH UA: 6.5
Protein, UA: NEGATIVE
Spec Grav, UA: 1.02
UROBILINOGEN UA: 0.2

## 2014-04-13 MED ORDER — HYDROCODONE-ACETAMINOPHEN 5-325 MG PO TABS: 1.0000 | ORAL_TABLET | Freq: Three times a day (TID) | ORAL | Status: DC | PRN

## 2014-04-13 MED ORDER — PREDNISONE 10 MG PO TABS: ORAL_TABLET | ORAL | Status: DC

## 2014-04-13 MED ORDER — ALPRAZOLAM 0.25 MG PO TABS: 0.2500 mg | ORAL_TABLET | Freq: Two times a day (BID) | ORAL | Status: DC | PRN

## 2014-04-13 NOTE — Assessment & Plan Note (Signed)
Symptoms and exam are c/w left sciatic pain. Will start prednisone burst pack (limiting dose because of anxiety). Will use Hydrocodone prn severe pain. Add low dose of Alprazolam for severe anxiety related to pain. Discussed potential risks/benefits of these medications. Follow up early next week or sooner as needed.

## 2014-04-13 NOTE — Progress Notes (Signed)
Subjective:    Patient ID: Christina [name], female    DOB: January 17, 1946, 69 y.o.   MRN: [id number]  HPI  69YO female presents for acute visit.  Left lower back pain - Recently diagnosed and treated for UTI on 2/6 at Urgent Care with Cipro. Follow up UA on 2/17 was normal. However, having persistent left lower back pain.  No burning with urination. No fever. No urgency or frequency of urination. Now having left hip pain radiating down back of left leg. Lower leg numb at times. Had recently started working out at gym. Sometimes feels that leg will give out on her, but has not had any falls. Tearful describing this. Pain has been severe despite taking Advil. Took a left over dose of Oxycodone with some improvement. Unable to sleep even with Ambien.   Past medical, surgical, family and social history per today's encounter.  Review of Systems  Constitutional: Negative for fever, chills, appetite change, fatigue and unexpected weight change.  Eyes: Negative for visual disturbance.  Respiratory: Negative for shortness of breath.   Cardiovascular: Negative for chest pain and leg swelling.  Gastrointestinal: Negative for nausea, vomiting, abdominal pain, diarrhea and constipation.  Genitourinary: Positive for flank pain. Negative for dysuria, urgency, frequency and difficulty urinating.  Musculoskeletal: Positive for myalgias, back pain and arthralgias.  Skin: Negative for color change and rash.  Neurological: Positive for weakness and numbness.  Hematological: Negative for adenopathy. Does not bruise/bleed easily.  Psychiatric/Behavioral: Negative for dysphoric mood. The patient is not nervous/anxious.        Objective:    BP 138/77 mmHg  Pulse 75  Temp(Src) 97.8 F (36.6 C) (Oral)  Ht 5' 4.1" (1.628 m)  Wt 186 lb 6 oz (84.539 kg)  BMI 31.90 kg/m2  SpO2 97% Physical Exam  Constitutional: She is oriented to person, place, and time. She appears well-developed and well-nourished.  No distress.  HENT:  Head: Normocephalic and atraumatic.  Right Ear: External ear normal.  Left Ear: External ear normal.  Nose: Nose normal.  Mouth/Throat: Oropharynx is clear and moist. No oropharyngeal exudate.  Eyes: Conjunctivae are normal. Pupils are equal, round, and reactive to light. Right eye exhibits no discharge. Left eye exhibits no discharge. No scleral icterus.  Neck: Normal range of motion. Neck supple. No tracheal deviation present. No thyromegaly present.  Cardiovascular: Normal rate, regular rhythm, normal heart sounds and intact distal pulses.  Exam reveals no gallop and no friction rub.   No murmur heard. Pulmonary/Chest: Effort normal and breath sounds normal. No respiratory distress. She has no wheezes. She has no rales. She exhibits no tenderness.  Musculoskeletal: Normal range of motion. She exhibits no edema.       Lumbar back: She exhibits tenderness and pain.       Back:  Lymphadenopathy:    She has no cervical adenopathy.  Neurological: She is alert and oriented to person, place, and time. No cranial nerve deficit. She exhibits normal muscle tone. Coordination normal.  Skin: Skin is warm and dry. No rash noted. She is not diaphoretic. No erythema. No pallor.  Psychiatric: She has a normal mood and affect. Her behavior is normal. Judgment and thought content normal.          Assessment & Plan:   Problem List Items Addressed This Visit      Unprioritized   Left sciatic nerve pain - Primary    Symptoms and exam are c/w left sciatic pain. Will start prednisone burst pack (limiting  dose because of anxiety). Will use Hydrocodone prn severe pain. Add low dose of Alprazolam for severe anxiety related to pain. Discussed potential risks/benefits of these medications. Follow up early next week or sooner as needed.      Relevant Medications   Amantadine HCl 100 MG tablet   pramipexole (MIRAPEX) 0.25 MG tablet   predniSONE (DELTASONE) tablet    HYDROcodone-acetaminophen (NORCO/VICODIN) 5-325 MG per tablet   ALPRAZolam  Prudy Feeler(XANAX) tablet   Other Relevant Orders   CULTURE, URINE COMPREHENSIVE   RESOLVED: Urinary tract infectious disease   Relevant Orders   POCT urinalysis dipstick (Completed)   CULTURE, URINE COMPREHENSIVE       Return in about 3 days (around 04/16/2014).

## 2014-04-13 NOTE — Progress Notes (Signed)
Pre visit review using our clinic review tool, if applicable. No additional management support is needed unless otherwise documented below in the visit note. 

## 2014-04-13 NOTE — Patient Instructions (Signed)
Sciatica Sciatica is pain, weakness, numbness, or tingling along the path of the sciatic nerve. The nerve starts in the lower back and runs down the back of each leg. The nerve controls the muscles in the lower leg and in the back of the knee, while also providing sensation to the back of the thigh, lower leg, and the sole of your foot. Sciatica is a symptom of another medical condition. For instance, nerve damage or certain conditions, such as a herniated disk or bone spur on the spine, pinch or put pressure on the sciatic nerve. This causes the pain, weakness, or other sensations normally associated with sciatica. Generally, sciatica only affects one side of the body. CAUSES   Herniated or slipped disc.  Degenerative disk disease.  A pain disorder involving the narrow muscle in the buttocks (piriformis syndrome).  Pelvic injury or fracture.  Pregnancy.  Tumor (rare). SYMPTOMS  Symptoms can vary from mild to very severe. The symptoms usually travel from the low back to the buttocks and down the back of the leg. Symptoms can include:  Mild tingling or dull aches in the lower back, leg, or hip.  Numbness in the back of the calf or sole of the foot.  Burning sensations in the lower back, leg, or hip.  Sharp pains in the lower back, leg, or hip.  Leg weakness.  Severe back pain inhibiting movement. These symptoms may get worse with coughing, sneezing, laughing, or prolonged sitting or standing. Also, being overweight may worsen symptoms. DIAGNOSIS  Your caregiver will perform a physical exam to look for common symptoms of sciatica. He or she may ask you to do certain movements or activities that would trigger sciatic nerve pain. Other tests may be performed to find the cause of the sciatica. These may include:  Blood tests.  X-rays.  Imaging tests, such as an MRI or CT scan. TREATMENT  Treatment is directed at the cause of the sciatic pain. Sometimes, treatment is not necessary  and the pain and discomfort goes away on its own. If treatment is needed, your caregiver may suggest:  Over-the-counter medicines to relieve pain.  Prescription medicines, such as anti-inflammatory medicine, muscle relaxants, or narcotics.  Applying heat or ice to the painful area.  Steroid injections to lessen pain, irritation, and inflammation around the nerve.  Reducing activity during periods of pain.  Exercising and stretching to strengthen your abdomen and improve flexibility of your spine. Your caregiver may suggest losing weight if the extra weight makes the back pain worse.  Physical therapy.  Surgery to eliminate what is pressing or pinching the nerve, such as a bone spur or part of a herniated disk. HOME CARE INSTRUCTIONS   Only take over-the-counter or prescription medicines for pain or discomfort as directed by your caregiver.  Apply ice to the affected area for 20 minutes, 3-4 times a day for the first 48-72 hours. Then try heat in the same way.  Exercise, stretch, or perform your usual activities if these do not aggravate your pain.  Attend physical therapy sessions as directed by your caregiver.  Keep all follow-up appointments as directed by your caregiver.  Do not wear high heels or shoes that do not provide proper support.  Check your mattress to see if it is too soft. A firm mattress may lessen your pain and discomfort. SEEK IMMEDIATE MEDICAL CARE IF:   You lose control of your bowel or bladder (incontinence).  You have increasing weakness in the lower back, pelvis, buttocks,   or legs.  You have redness or swelling of your back.  You have a burning sensation when you urinate.  You have pain that gets worse when you lie down or awakens you at night.  Your pain is worse than you have experienced in the past.  Your pain is lasting longer than 4 weeks.  You are suddenly losing weight without reason. MAKE SURE YOU:  Understand these  instructions.  Will watch your condition.  Will get help right away if you are not doing well or get worse. Document Released: 01/28/2001 Document Revised: 08/05/2011 Document Reviewed: 06/15/2011 Exit[employer] Patient Information 2015 [employer]. This information is not intended to replace advice given to you by your health care provider. Make sure you discuss any questions you have with your health care provider.  

## 2014-04-16 LAB — CULTURE, URINE COMPREHENSIVE: Colony Count: 45000

## 2014-04-17 ENCOUNTER — Ambulatory Visit: Payer: Medicare Other | Admitting: Internal Medicine

## 2014-04-17 ENCOUNTER — Other Ambulatory Visit: Payer: Self-pay | Admitting: *Deleted

## 2014-04-17 DIAGNOSIS — M5442 Lumbago with sciatica, left side: Secondary | ICD-10-CM | POA: Diagnosis not present

## 2014-04-17 DIAGNOSIS — M5137 Other intervertebral disc degeneration, lumbosacral region: Secondary | ICD-10-CM | POA: Diagnosis not present

## 2014-04-17 DIAGNOSIS — M9907 Segmental and somatic dysfunction of upper extremity: Secondary | ICD-10-CM | POA: Diagnosis not present

## 2014-04-17 DIAGNOSIS — M9901 Segmental and somatic dysfunction of cervical region: Secondary | ICD-10-CM | POA: Diagnosis not present

## 2014-04-17 DIAGNOSIS — M5136 Other intervertebral disc degeneration, lumbar region: Secondary | ICD-10-CM | POA: Diagnosis not present

## 2014-04-17 DIAGNOSIS — M9902 Segmental and somatic dysfunction of thoracic region: Secondary | ICD-10-CM | POA: Diagnosis not present

## 2014-04-17 MED ORDER — CEPHALEXIN 500 MG PO CAPS: 500.0000 mg | ORAL_CAPSULE | Freq: Three times a day (TID) | ORAL | Status: DC

## 2014-04-18 ENCOUNTER — Ambulatory Visit: Payer: Medicare Other | Admitting: Internal Medicine

## 2014-04-20 DIAGNOSIS — M5137 Other intervertebral disc degeneration, lumbosacral region: Secondary | ICD-10-CM | POA: Diagnosis not present

## 2014-04-20 DIAGNOSIS — M5136 Other intervertebral disc degeneration, lumbar region: Secondary | ICD-10-CM | POA: Diagnosis not present

## 2014-04-20 DIAGNOSIS — M5442 Lumbago with sciatica, left side: Secondary | ICD-10-CM | POA: Diagnosis not present

## 2014-04-20 DIAGNOSIS — M9903 Segmental and somatic dysfunction of lumbar region: Secondary | ICD-10-CM | POA: Diagnosis not present

## 2014-04-25 DIAGNOSIS — M5136 Other intervertebral disc degeneration, lumbar region: Secondary | ICD-10-CM | POA: Diagnosis not present

## 2014-04-25 DIAGNOSIS — M9903 Segmental and somatic dysfunction of lumbar region: Secondary | ICD-10-CM | POA: Diagnosis not present

## 2014-04-25 DIAGNOSIS — M5137 Other intervertebral disc degeneration, lumbosacral region: Secondary | ICD-10-CM | POA: Diagnosis not present

## 2014-05-01 ENCOUNTER — Encounter: Payer: Self-pay | Admitting: Internal Medicine

## 2014-05-02 ENCOUNTER — Other Ambulatory Visit: Payer: Self-pay | Admitting: Internal Medicine

## 2014-05-15 ENCOUNTER — Ambulatory Visit (INDEPENDENT_AMBULATORY_CARE_PROVIDER_SITE_OTHER): Payer: Medicare Other | Admitting: Internal Medicine

## 2014-05-15 ENCOUNTER — Encounter: Payer: Self-pay | Admitting: Internal Medicine

## 2014-05-15 VITALS — BP 139/73 | HR 67 | Temp 98.6°F | Ht 64.1 in | Wt 182.4 lb

## 2014-05-15 DIAGNOSIS — R208 Other disturbances of skin sensation: Secondary | ICD-10-CM

## 2014-05-15 DIAGNOSIS — R200 Anesthesia of skin: Secondary | ICD-10-CM

## 2014-05-15 LAB — CBC WITH DIFFERENTIAL/PLATELET
BASOS PCT: 0.8 % (ref 0.0–3.0)
Basophils Absolute: 0.1 10*3/uL (ref 0.0–0.1)
EOS PCT: 1.8 % (ref 0.0–5.0)
Eosinophils Absolute: 0.1 10*3/uL (ref 0.0–0.7)
HEMATOCRIT: 44.5 % (ref 36.0–46.0)
HEMOGLOBIN: 15.1 g/dL — AB (ref 12.0–15.0)
Lymphocytes Relative: 39.3 % (ref 12.0–46.0)
Lymphs Abs: 2.5 10*3/uL (ref 0.7–4.0)
MCHC: 33.9 g/dL (ref 30.0–36.0)
MCV: 88.8 fl (ref 78.0–100.0)
MONOS PCT: 8.6 % (ref 3.0–12.0)
Monocytes Absolute: 0.6 10*3/uL (ref 0.1–1.0)
Neutro Abs: 3.2 10*3/uL (ref 1.4–7.7)
Neutrophils Relative %: 49.5 % (ref 43.0–77.0)
Platelets: 219 10*3/uL (ref 150.0–400.0)
RBC: 5.01 Mil/uL (ref 3.87–5.11)
RDW: 14 % (ref 11.5–15.5)
WBC: 6.4 10*3/uL (ref 4.0–10.5)

## 2014-05-15 LAB — VITAMIN B12: Vitamin B-12: 501 pg/mL (ref 211–911)

## 2014-05-15 LAB — TSH: TSH: 3.36 u[IU]/mL (ref 0.35–4.50)

## 2014-05-15 LAB — HEMOGLOBIN A1C: Hgb A1c MFr Bld: 6 % (ref 4.6–6.5)

## 2014-05-15 NOTE — Progress Notes (Signed)
Subjective:    Patient ID: Christina [name], female    DOB: 1945-07-25, 69 y.o.   MRN: [id number]  HPI 69YO female presents for acute visit.  Numbness - Both feet have been feeling numb over the last month. No back pain. No LOC of bowel or bladder. Not taking any new supplements. Recently checked blood sugar as has had borderline DM in the past.BG was 138  BP Readings from Last 3 Encounters:  05/15/14 139/73  04/13/14 138/77  01/16/14 118/60   Wt Readings from Last 3 Encounters:  05/15/14 182 lb 6 oz (82.725 kg)  04/13/14 186 lb 6 oz (84.539 kg)  01/16/14 189 lb 8 oz (85.957 kg)    Past medical, surgical, family and social history per today's encounter.  Review of Systems  Constitutional: Negative for fever, chills, appetite change, fatigue and unexpected weight change.  Eyes: Negative for visual disturbance.  Respiratory: Negative for shortness of breath.   Cardiovascular: Negative for chest pain and leg swelling.  Gastrointestinal: Negative for abdominal pain, diarrhea and constipation.  Musculoskeletal: Negative for myalgias, back pain and arthralgias.  Skin: Negative for color change and rash.  Neurological: Positive for tremors, weakness and numbness. Negative for light-headedness.  Hematological: Negative for adenopathy. Does not bruise/bleed easily.  Psychiatric/Behavioral: Negative for sleep disturbance and dysphoric mood. The patient is not nervous/anxious.        Objective:    BP 139/73 mmHg  Pulse 67  Temp(Src) 98.6 F (37 C) (Oral)  Ht 5' 4.1" (1.628 m)  Wt 182 lb 6 oz (82.725 kg)  BMI 31.21 kg/m2  SpO2 96% Physical Exam  Constitutional: She is oriented to person, place, and time. She appears well-developed and well-nourished. No distress.  HENT:  Head: Normocephalic and atraumatic.  Right Ear: External ear normal.  Left Ear: External ear normal.  Nose: Nose normal.  Mouth/Throat: Oropharynx is clear and moist.  Eyes: Conjunctivae are normal.  Pupils are equal, round, and reactive to light. Right eye exhibits no discharge. Left eye exhibits no discharge. No scleral icterus.  Neck: Normal range of motion. Neck supple. No tracheal deviation present. No thyromegaly present.  Cardiovascular: Normal rate, regular rhythm, normal heart sounds and intact distal pulses.  Exam reveals no gallop and no friction rub.   No murmur heard. Pulmonary/Chest: Effort normal and breath sounds normal. No respiratory distress. She has no wheezes. She has no rales. She exhibits no tenderness.  Musculoskeletal: Normal range of motion. She exhibits no edema or tenderness.  Lymphadenopathy:    She has no cervical adenopathy.  Neurological: She is alert and oriented to person, place, and time. She displays tremor. She displays no atrophy. No cranial nerve deficit or sensory deficit. She exhibits normal muscle tone. Coordination and gait normal.  Sensation intact to monofilament bilateral LE   Skin: Skin is warm and dry. No rash noted. She is not diaphoretic. No erythema. No pallor.  Psychiatric: She has a normal mood and affect. Her behavior is normal. Judgment and thought content normal.          Assessment & Plan:   Problem List Items Addressed This Visit      Unprioritized   Bilateral leg numbness - Primary    Bilateral lower leg numbness x 1 month. Exam normal including sensation to monofilament. Will check CBC, B12, TSH, CMP. If labs normal, discussed neurology referral to Dr. Sherryll BurgerShah for nerve conduction testing.      Relevant Orders   CBC w/Diff   B12  Hemoglobin A1c   TSH       Return in about 4 weeks (around 06/12/2014) for Recheck.

## 2014-05-15 NOTE — Patient Instructions (Signed)
Labs today.  If labs are normal, then we will set up evaluation with neurology for possible nerve conduction study.

## 2014-05-15 NOTE — Progress Notes (Signed)
Pre visit review using our clinic review tool, if applicable. No additional management support is needed unless otherwise documented below in the visit note. 

## 2014-05-15 NOTE — Assessment & Plan Note (Signed)
Bilateral lower leg numbness x 1 month. Exam normal including sensation to monofilament. Will check CBC, B12, TSH, CMP. If labs normal, discussed neurology referral to Dr. Sherryll BurgerShah for nerve conduction testing.

## 2014-05-17 ENCOUNTER — Encounter: Payer: Self-pay | Admitting: Internal Medicine

## 2014-05-17 DIAGNOSIS — R202 Paresthesia of skin: Secondary | ICD-10-CM

## 2014-06-15 DIAGNOSIS — R202 Paresthesia of skin: Secondary | ICD-10-CM | POA: Diagnosis not present

## 2014-06-15 DIAGNOSIS — G701 Toxic myoneural disorders: Secondary | ICD-10-CM | POA: Diagnosis not present

## 2014-06-19 DIAGNOSIS — R202 Paresthesia of skin: Secondary | ICD-10-CM | POA: Diagnosis not present

## 2014-07-18 ENCOUNTER — Ambulatory Visit: Payer: Medicare Other | Admitting: Internal Medicine

## 2014-07-27 ENCOUNTER — Telehealth: Payer: Self-pay | Admitting: Internal Medicine

## 2014-07-27 NOTE — Telephone Encounter (Signed)
2pm Wed June 15th

## 2014-07-27 NOTE — Telephone Encounter (Signed)
Pt needs a follow up appt for swelling of feet. Pt was told by neurologist to follow up with dr. Dan Humphreys. Neurologist has stopped pt from taking meds that he has prescribe due to thinking pt may be having a reaction to meds. Pt request afternoon appt. Due to swelling is worst in the evenings. Please advise where to add pt/msn

## 2014-07-27 NOTE — Telephone Encounter (Signed)
Please see below request  

## 2014-08-02 ENCOUNTER — Ambulatory Visit (INDEPENDENT_AMBULATORY_CARE_PROVIDER_SITE_OTHER): Payer: Medicare Other | Admitting: Internal Medicine

## 2014-08-02 ENCOUNTER — Encounter: Payer: Self-pay | Admitting: Internal Medicine

## 2014-08-02 VITALS — BP 126/64 | HR 70 | Temp 97.9°F | Resp 12 | Ht 64.1 in | Wt 184.2 lb

## 2014-08-02 DIAGNOSIS — Z79899 Other long term (current) drug therapy: Secondary | ICD-10-CM | POA: Diagnosis not present

## 2014-08-02 DIAGNOSIS — G20 Parkinson's disease: Secondary | ICD-10-CM

## 2014-08-02 DIAGNOSIS — R609 Edema, unspecified: Secondary | ICD-10-CM | POA: Diagnosis not present

## 2014-08-02 LAB — COMPREHENSIVE METABOLIC PANEL
ALBUMIN: 4.4 g/dL (ref 3.5–5.2)
ALT: 22 U/L (ref 0–35)
AST: 21 U/L (ref 0–37)
Alkaline Phosphatase: 71 U/L (ref 39–117)
BUN: 21 mg/dL (ref 6–23)
CALCIUM: 10 mg/dL (ref 8.4–10.5)
CO2: 28 mEq/L (ref 19–32)
Chloride: 101 mEq/L (ref 96–112)
Creatinine, Ser: 0.85 mg/dL (ref 0.40–1.20)
GFR: 70.85 mL/min (ref >60.00)
GLUCOSE: 99 mg/dL (ref 70–99)
Potassium: 3.9 mEq/L (ref 3.5–5.1)
Sodium: 136 mEq/L (ref 135–145)
Total Bilirubin: 0.4 mg/dL (ref 0.2–1.2)
Total Protein: 7.4 g/dL (ref 6.0–8.3)

## 2014-08-02 LAB — TSH: TSH: 1.77 u[IU]/mL (ref 0.35–4.50)

## 2014-08-02 MED ORDER — FUROSEMIDE 20 MG PO TABS: 20.0000 mg | ORAL_TABLET | Freq: Every day | ORAL | Status: DC

## 2014-08-02 NOTE — Assessment & Plan Note (Signed)
Persistent BLE edema. Will add prn Furosemide 20mg  daily. Encouraged her to limit salt intake, keep feet elevated, use compression hose. Follow up recheck in 4 weeks and prn. If no improvement, consider vascular evaluation.

## 2014-08-02 NOTE — Progress Notes (Signed)
Pre visit review using our clinic review tool, if applicable. No additional management support is needed unless otherwise documented below in the visit note. 

## 2014-08-02 NOTE — Assessment & Plan Note (Signed)
Worsening tremor since coming off medications. Encouraged her to follow up with her neurologist to discuss restarting medication.

## 2014-08-02 NOTE — Progress Notes (Signed)
   Subjective:    Patient ID: Christina [name], female    DOB: 08/25/1945, 69 y.o.   MRN: [id number]  HPI  69YO female presents for acute visit.  Edema - Bilateral LE swelling right>left over last few weeks. Taken off Amantadine to help with swelling but no improvement. Compliant with Losartan-HCTZ. Tremor has been worse off medication.  Past medical, surgical, family and social history per today's encounter.  Review of Systems  Constitutional: Negative for fever, chills, appetite change, fatigue and unexpected weight change.  Eyes: Negative for visual disturbance.  Respiratory: Negative for shortness of breath.   Cardiovascular: Positive for leg swelling. Negative for chest pain and palpitations.  Gastrointestinal: Negative for abdominal pain, diarrhea and constipation.  Skin: Negative for color change and rash.  Neurological: Positive for tremors. Negative for weakness.  Hematological: Negative for adenopathy. Does not bruise/bleed easily.  Psychiatric/Behavioral: Negative for sleep disturbance and dysphoric mood. The patient is not nervous/anxious.        Objective:    BP 126/64 mmHg  Pulse 70  Temp(Src) 97.9 F (36.6 C) (Oral)  Resp 12  Ht 5' 4.1" (1.628 m)  Wt 184 lb 3.2 oz (83.553 kg)  BMI 31.52 kg/m2  SpO2 96% Physical Exam  Constitutional: She is oriented to person, place, and time. She appears well-developed and well-nourished. No distress.  HENT:  Head: Normocephalic and atraumatic.  Right Ear: External ear normal.  Left Ear: External ear normal.  Nose: Nose normal.  Mouth/Throat: Oropharynx is clear and moist. No oropharyngeal exudate.  Eyes: Conjunctivae are normal. Pupils are equal, round, and reactive to light. Right eye exhibits no discharge. Left eye exhibits no discharge. No scleral icterus.  Neck: Normal range of motion. Neck supple. No tracheal deviation present. No thyromegaly present.  Cardiovascular: Normal rate, regular rhythm, normal heart  sounds and intact distal pulses.  Exam reveals no gallop and no friction rub.   No murmur heard. Pulmonary/Chest: Effort normal and breath sounds normal. No respiratory distress. She has no wheezes. She has no rales. She exhibits no tenderness.  Musculoskeletal: Normal range of motion. She exhibits edema (pitting bilateral lower legs right>left to mid shin). She exhibits no tenderness.  Lymphadenopathy:    She has no cervical adenopathy.  Neurological: She is alert and oriented to person, place, and time. She displays tremor (right>left hand). No cranial nerve deficit. She exhibits normal muscle tone. Coordination and gait normal.  Skin: Skin is warm and dry. No rash noted. She is not diaphoretic. No erythema. No pallor.  Psychiatric: She has a normal mood and affect. Her behavior is normal. Judgment and thought content normal.          Assessment & Plan:   Problem List Items Addressed This Visit      Unprioritized   Edema - Primary    Persistent BLE edema. Will add prn Furosemide 20mg  daily. Encouraged her to limit salt intake, keep feet elevated, use compression hose. Follow up recheck in 4 weeks and prn. If no improvement, consider vascular evaluation.      Relevant Orders   Comprehensive metabolic panel   TSH   PD (Parkinson's disease)    Worsening tremor since coming off medications. Encouraged her to follow up with her neurologist to discuss restarting medication.          Return in about 4 weeks (around 08/30/2014) for Recheck.

## 2014-08-02 NOTE — Patient Instructions (Signed)
We will check electrolytes today.  Start Lasix (Furosemide) 20mg  daily as needed for persistent swelling.  Limit salt intake. Consider wearing compression stockings.  If swelling persistent, we will set up vascular evaluation.

## 2014-08-08 ENCOUNTER — Other Ambulatory Visit: Payer: Self-pay | Admitting: Internal Medicine

## 2014-08-24 ENCOUNTER — Encounter: Payer: Self-pay | Admitting: Internal Medicine

## 2014-09-05 ENCOUNTER — Other Ambulatory Visit: Payer: Self-pay | Admitting: Internal Medicine

## 2014-09-08 ENCOUNTER — Encounter: Payer: Self-pay | Admitting: Internal Medicine

## 2014-09-08 ENCOUNTER — Ambulatory Visit (INDEPENDENT_AMBULATORY_CARE_PROVIDER_SITE_OTHER): Payer: Medicare Other | Admitting: Internal Medicine

## 2014-09-08 VITALS — BP 131/66 | HR 49 | Temp 98.5°F | Ht 64.1 in | Wt 181.0 lb

## 2014-09-08 DIAGNOSIS — G4700 Insomnia, unspecified: Secondary | ICD-10-CM | POA: Diagnosis not present

## 2014-09-08 DIAGNOSIS — G20 Parkinson's disease: Secondary | ICD-10-CM

## 2014-09-08 DIAGNOSIS — R609 Edema, unspecified: Secondary | ICD-10-CM | POA: Diagnosis not present

## 2014-09-08 MED ORDER — ZOLPIDEM TARTRATE 5 MG PO TABS: 5.0000 mg | ORAL_TABLET | Freq: Every evening | ORAL | Status: DC | PRN

## 2014-09-08 NOTE — Assessment & Plan Note (Signed)
Tremor worsened off Amantadine. Improved with restarting medication. Follow up with neurology pending for 2 weeks.

## 2014-09-08 NOTE — Progress Notes (Signed)
Subjective:    Patient ID: Christina [name], female    DOB: [birth year], 69 y.o.   MRN: [id number]  HPI  69YO female presents for follow up.  Last seen 6/15 for leg swelling.  Symptoms improved. Only trace ankle swelling noted on occasion. Taking Furosemide about every other day or less.  Insomnia - Taking Ambien daily. Gets about 6hr sleep with this. No daytime fatigue. Tolerating well  Parkinson's - Tremor worsened off Amantadine, so she started back on medication. Notes some improvement with this.  Past medical, surgical, family and social history per today's encounter.  Review of Systems  Constitutional: Negative for fever, chills, appetite change, fatigue and unexpected weight change.  Eyes: Negative for visual disturbance.  Respiratory: Negative for shortness of breath.   Cardiovascular: Negative for chest pain and leg swelling.  Gastrointestinal: Negative for abdominal pain, diarrhea and constipation.  Musculoskeletal: Negative for myalgias and arthralgias.  Skin: Negative for color change and rash.  Neurological: Positive for tremors. Negative for weakness and numbness.  Hematological: Negative for adenopathy. Does not bruise/bleed easily.  Psychiatric/Behavioral: Positive for sleep disturbance. Negative for dysphoric mood. The patient is not nervous/anxious.        Objective:    BP 131/66 mmHg  Pulse 49  Temp(Src) 98.5 F (36.9 C) (Oral)  Ht 5' 4.1" (1.628 m)  Wt 181 lb (82.101 kg)  BMI 30.98 kg/m2  SpO2 95% Physical Exam  Constitutional: She is oriented to person, place, and time. She appears well-developed and well-nourished. No distress.  HENT:  Head: Normocephalic and atraumatic.  Right Ear: External ear normal.  Left Ear: External ear normal.  Nose: Nose normal.  Mouth/Throat: Oropharynx is clear and moist. No oropharyngeal exudate.  Eyes: Conjunctivae are normal. Pupils are equal, round, and reactive to light. Right eye exhibits no discharge. Left  eye exhibits no discharge. No scleral icterus.  Neck: Normal range of motion. Neck supple. No tracheal deviation present. No thyromegaly present.  Cardiovascular: Normal rate, regular rhythm, normal heart sounds and intact distal pulses.  Exam reveals no gallop and no friction rub.   No murmur heard. Pulmonary/Chest: Effort normal and breath sounds normal. No respiratory distress. She has no wheezes. She has no rales. She exhibits no tenderness.  Musculoskeletal: Normal range of motion. She exhibits no edema or tenderness.  Lymphadenopathy:    She has no cervical adenopathy.  Neurological: She is alert and oriented to person, place, and time. She displays tremor (bilateral hand tremor). No cranial nerve deficit. She exhibits normal muscle tone. Coordination and gait normal.  Skin: Skin is warm and dry. No rash noted. She is not diaphoretic. No erythema. No pallor.  Psychiatric: She has a normal mood and affect. Her behavior is normal. Judgment and thought content normal.          Assessment & Plan:   Problem List Items Addressed This Visit      Unprioritized   Edema - Primary    Edema improved with prn Furosemide. Will continue prn.      Insomnia    Symptoms improved with use of Ambien. She understands the potential risks of this medication. Will continue.      Relevant Medications   zolpidem (AMBIEN) 5 MG tablet   PD (Parkinson's disease)    Tremor worsened off Amantadine. Improved with restarting medication. Follow up with neurology pending for 2 weeks.      Relevant Medications   AMANTADINE HCL PO       Return in  about 6 months (around 03/11/2015) for Wellness Visit.

## 2014-09-08 NOTE — Assessment & Plan Note (Signed)
Symptoms improved with use of Ambien. She understands the potential risks of this medication. Will continue.

## 2014-09-08 NOTE — Patient Instructions (Signed)
Follow up in 6 months and sooner as needed.

## 2014-09-08 NOTE — Assessment & Plan Note (Signed)
Edema improved with prn Furosemide. Will continue prn.

## 2014-09-08 NOTE — Progress Notes (Signed)
Pre visit review using our clinic review tool, if applicable. No additional management support is needed unless otherwise documented below in the visit note. 

## 2014-09-26 DIAGNOSIS — F5101 Primary insomnia: Secondary | ICD-10-CM | POA: Diagnosis not present

## 2014-09-26 DIAGNOSIS — G20 Parkinson's disease: Secondary | ICD-10-CM | POA: Diagnosis not present

## 2014-09-26 DIAGNOSIS — R202 Paresthesia of skin: Secondary | ICD-10-CM | POA: Diagnosis not present

## 2014-09-26 DIAGNOSIS — G701 Toxic myoneural disorders: Secondary | ICD-10-CM | POA: Diagnosis not present

## 2014-10-17 DIAGNOSIS — H04123 Dry eye syndrome of bilateral lacrimal glands: Secondary | ICD-10-CM | POA: Diagnosis not present

## 2014-11-17 DIAGNOSIS — H04123 Dry eye syndrome of bilateral lacrimal glands: Secondary | ICD-10-CM | POA: Diagnosis not present

## 2014-11-24 DIAGNOSIS — Z23 Encounter for immunization: Secondary | ICD-10-CM | POA: Diagnosis not present

## 2014-11-24 DIAGNOSIS — M9903 Segmental and somatic dysfunction of lumbar region: Secondary | ICD-10-CM | POA: Diagnosis not present

## 2014-11-24 DIAGNOSIS — M545 Low back pain: Secondary | ICD-10-CM | POA: Diagnosis not present

## 2014-12-01 ENCOUNTER — Other Ambulatory Visit: Payer: Self-pay | Admitting: Internal Medicine

## 2014-12-01 ENCOUNTER — Other Ambulatory Visit: Payer: Self-pay

## 2014-12-01 MED ORDER — LOSARTAN POTASSIUM-HCTZ 50-12.5 MG PO TABS: 1.0000 | ORAL_TABLET | Freq: Every day | ORAL | Status: DC

## 2014-12-12 DIAGNOSIS — M545 Low back pain: Secondary | ICD-10-CM | POA: Diagnosis not present

## 2014-12-12 DIAGNOSIS — M9903 Segmental and somatic dysfunction of lumbar region: Secondary | ICD-10-CM | POA: Diagnosis not present

## 2014-12-12 DIAGNOSIS — M9904 Segmental and somatic dysfunction of sacral region: Secondary | ICD-10-CM | POA: Diagnosis not present

## 2014-12-12 DIAGNOSIS — M9902 Segmental and somatic dysfunction of thoracic region: Secondary | ICD-10-CM | POA: Diagnosis not present

## 2014-12-19 DIAGNOSIS — M9904 Segmental and somatic dysfunction of sacral region: Secondary | ICD-10-CM | POA: Diagnosis not present

## 2014-12-19 DIAGNOSIS — M9903 Segmental and somatic dysfunction of lumbar region: Secondary | ICD-10-CM | POA: Diagnosis not present

## 2014-12-19 DIAGNOSIS — M545 Low back pain: Secondary | ICD-10-CM | POA: Diagnosis not present

## 2014-12-28 DIAGNOSIS — R251 Tremor, unspecified: Secondary | ICD-10-CM | POA: Diagnosis not present

## 2014-12-28 DIAGNOSIS — G20 Parkinson's disease: Secondary | ICD-10-CM | POA: Diagnosis not present

## 2015-01-01 DIAGNOSIS — M545 Low back pain: Secondary | ICD-10-CM | POA: Diagnosis not present

## 2015-01-01 DIAGNOSIS — M9904 Segmental and somatic dysfunction of sacral region: Secondary | ICD-10-CM | POA: Diagnosis not present

## 2015-01-01 DIAGNOSIS — M9903 Segmental and somatic dysfunction of lumbar region: Secondary | ICD-10-CM | POA: Diagnosis not present

## 2015-01-15 ENCOUNTER — Ambulatory Visit (INDEPENDENT_AMBULATORY_CARE_PROVIDER_SITE_OTHER): Payer: Medicare Other | Admitting: Internal Medicine

## 2015-01-15 ENCOUNTER — Encounter: Payer: Self-pay | Admitting: Internal Medicine

## 2015-01-15 VITALS — BP 115/63 | HR 67 | Temp 98.5°F | Ht 64.1 in | Wt 186.4 lb

## 2015-01-15 DIAGNOSIS — M9903 Segmental and somatic dysfunction of lumbar region: Secondary | ICD-10-CM | POA: Diagnosis not present

## 2015-01-15 DIAGNOSIS — M9904 Segmental and somatic dysfunction of sacral region: Secondary | ICD-10-CM | POA: Diagnosis not present

## 2015-01-15 DIAGNOSIS — J209 Acute bronchitis, unspecified: Secondary | ICD-10-CM | POA: Diagnosis not present

## 2015-01-15 DIAGNOSIS — M545 Low back pain: Secondary | ICD-10-CM | POA: Diagnosis not present

## 2015-01-15 MED ORDER — FLUCONAZOLE 150 MG PO TABS: 150.0000 mg | ORAL_TABLET | Freq: Once | ORAL | Status: DC

## 2015-01-15 MED ORDER — HYDROCOD POLST-CPM POLST ER 10-8 MG/5ML PO SUER: 5.0000 mL | Freq: Two times a day (BID) | ORAL | Status: DC | PRN

## 2015-01-15 MED ORDER — DOXYCYCLINE HYCLATE 100 MG PO TABS: 100.0000 mg | ORAL_TABLET | Freq: Two times a day (BID) | ORAL | Status: DC

## 2015-01-15 NOTE — Progress Notes (Signed)
Pre visit review using our clinic review tool, if applicable. No additional management support is needed unless otherwise documented below in the visit note. 

## 2015-01-15 NOTE — Progress Notes (Signed)
Subjective:    Patient ID: Christina [name], female    DOB: Apr 01, 1945, 69 y.o.   MRN: [id number]  HPI  69YO female presents for acute visit.  One week ago, developed congestion, cough. Symptoms have gradually worsened. No dyspnea. No chest pain. Cough now productive of thick mucous. Particularly bad at night. Taking Mucinex D with no improvement. No known sick contacts. Non-smoker.   Wt Readings from Last 3 Encounters:  01/15/15 186 lb 6 oz (84.539 kg)  09/08/14 181 lb (82.101 kg)  08/02/14 184 lb 3.2 oz (83.553 kg)   BP Readings from Last 3 Encounters:  01/15/15 115/63  09/08/14 131/66  08/02/14 126/64    Past Medical History  Diagnosis Date  . Vertigo   . Sinus infection   . Thyroid disease     hypothyroidism  . Diverticula of intestine    Family History  Problem Relation Age of Onset  . Lung cancer Mother   . Diabetes Mother   . Hypertension Mother   . Cancer Mother     lung  . Lung cancer Father   . Cancer Father     lung  . Diabetes Maternal Grandmother    Past Surgical History  Procedure Laterality Date  . Tubal ligation  1988  . Thyroidectomy  1976  . Gallbladder surgery  2005  . Vaginal delivery     Social History   Social History  . Marital Status: Married    Spouse Name: N/A  . Number of Children: 2  . Years of Education: N/A   Occupational History  . 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     Social History Main Topics  . Smoking status: Never Smoker   . Smokeless tobacco: Never Used  . Alcohol Use: Yes     Comment: occasional  . Drug Use: No  . Sexual Activity: Not Asked   Other Topics Concern  . None   Social History Narrative   Regular Exercise -  YES, twice a week   Daily Caffeine Use:  2 cups coffee   Lives with husband in [place]. No pets. Work [employer] in [employer].     Review of Systems  Constitutional: Positive for fatigue. Negative for fever, chills and unexpected weight change.  HENT: Positive for congestion  and sore throat (initially, now resolved). Negative for ear discharge, ear pain, facial swelling, hearing loss, mouth sores, nosebleeds, postnasal drip, rhinorrhea, sinus pressure, sneezing, tinnitus, trouble swallowing and voice change.   Eyes: Negative for pain, discharge, redness and visual disturbance.  Respiratory: Positive for cough. Negative for chest tightness, shortness of breath, wheezing and stridor.   Cardiovascular: Negative for chest pain, palpitations and leg swelling.  Gastrointestinal: Negative for nausea, vomiting, diarrhea and constipation.  Musculoskeletal: Negative for myalgias, arthralgias, neck pain and neck stiffness.  Skin: Negative for color change and rash.  Neurological: Negative for dizziness, weakness, light-headedness and headaches.  Hematological: Negative for adenopathy.  Psychiatric/Behavioral: Positive for sleep disturbance.       Objective:    BP 115/63 mmHg  Pulse 67  Temp(Src) 98.5 F (36.9 C) (Oral)  Ht 5' 4.1" (1.628 m)  Wt 186 lb 6 oz (84.539 kg)  BMI 31.90 kg/m2  SpO2 96% Physical Exam  Constitutional: She is oriented to person, place, and time. She appears well-developed and well-nourished. No distress.  HENT:  Head: Normocephalic and atraumatic.  Right Ear: External ear normal.  Left Ear: External ear normal.  Nose: Nose normal.  Mouth/Throat: Oropharynx is clear and moist. No oropharyngeal exudate.  Eyes: Conjunctivae are normal. Pupils are equal, round, and reactive to light. Right eye exhibits no discharge. Left eye exhibits no discharge. No scleral icterus.  Neck: Normal range of motion. Neck supple. No tracheal deviation present. No thyromegaly present.  Cardiovascular: Normal rate, regular rhythm, normal heart sounds and intact distal pulses.  Exam reveals no gallop and no friction rub.   No murmur heard. Pulmonary/Chest: Effort normal. No accessory muscle usage. No tachypnea. No respiratory distress. She has no decreased breath  sounds. She has no wheezes. She has rhonchi (scattered). She has no rales. She exhibits no tenderness.  Musculoskeletal: Normal range of motion. She exhibits no edema or tenderness.  Lymphadenopathy:    She has no cervical adenopathy.  Neurological: She is alert and oriented to person, place, and time. No cranial nerve deficit. She exhibits normal muscle tone. Coordination normal.  Skin: Skin is warm and dry. No rash noted. She is not diaphoretic. No erythema. No pallor.  Psychiatric: She has a normal mood and affect. Her behavior is normal. Judgment and thought content normal.          Assessment & Plan:   Problem List Items Addressed This Visit      Unprioritized   Acute bronchitis - Primary    Exam most consistent with viral infection with possible early secondary bronchitis. Encouraged her to wait, continue supportive care, increased fluid intake, rest, Mucinex, and prn Tussionex. If no improvement, then will plan to start Doxycycline. Follow up this week if symptoms are not improving.      Relevant Medications   doxycycline (VIBRA-TABS) 100 MG tablet   fluconazole (DIFLUCAN) 150 MG tablet   chlorpheniramine-HYDROcodone (TUSSIONEX PENNKINETIC ER) 10-8 MG/5ML SUER       Return if symptoms worsen or fail to improve.

## 2015-01-15 NOTE — Patient Instructions (Signed)

## 2015-01-15 NOTE — Assessment & Plan Note (Signed)
Exam most consistent with viral infection with possible early secondary bronchitis. Encouraged her to wait, continue supportive care, increased fluid intake, rest, Mucinex, and prn Tussionex. If no improvement, then will plan to start Doxycycline. Follow up this week if symptoms are not improving.

## 2015-02-15 DIAGNOSIS — M9903 Segmental and somatic dysfunction of lumbar region: Secondary | ICD-10-CM | POA: Diagnosis not present

## 2015-02-15 DIAGNOSIS — M9904 Segmental and somatic dysfunction of sacral region: Secondary | ICD-10-CM | POA: Diagnosis not present

## 2015-02-15 DIAGNOSIS — M9908 Segmental and somatic dysfunction of rib cage: Secondary | ICD-10-CM | POA: Diagnosis not present

## 2015-02-15 DIAGNOSIS — M545 Low back pain: Secondary | ICD-10-CM | POA: Diagnosis not present

## 2015-03-05 ENCOUNTER — Other Ambulatory Visit: Payer: Self-pay | Admitting: Internal Medicine

## 2015-03-12 ENCOUNTER — Ambulatory Visit (INDEPENDENT_AMBULATORY_CARE_PROVIDER_SITE_OTHER): Payer: Medicare HMO | Admitting: Internal Medicine

## 2015-03-12 ENCOUNTER — Ambulatory Visit: Payer: Medicare Other

## 2015-03-12 ENCOUNTER — Encounter: Payer: Self-pay | Admitting: Internal Medicine

## 2015-03-12 VITALS — BP 118/64 | HR 60 | Temp 97.1°F | Ht 64.1 in | Wt 190.2 lb

## 2015-03-12 DIAGNOSIS — Z23 Encounter for immunization: Secondary | ICD-10-CM | POA: Diagnosis not present

## 2015-03-12 DIAGNOSIS — Z1159 Encounter for screening for other viral diseases: Secondary | ICD-10-CM | POA: Diagnosis not present

## 2015-03-12 DIAGNOSIS — Z0000 Encounter for general adult medical examination without abnormal findings: Secondary | ICD-10-CM | POA: Diagnosis not present

## 2015-03-12 DIAGNOSIS — N393 Stress incontinence (female) (male): Secondary | ICD-10-CM | POA: Insufficient documentation

## 2015-03-12 DIAGNOSIS — G20 Parkinson's disease: Secondary | ICD-10-CM

## 2015-03-12 DIAGNOSIS — I10 Essential (primary) hypertension: Secondary | ICD-10-CM | POA: Diagnosis not present

## 2015-03-12 DIAGNOSIS — E038 Other specified hypothyroidism: Secondary | ICD-10-CM

## 2015-03-12 LAB — LIPID PANEL
Cholesterol: 221 mg/dL — ABNORMAL HIGH (ref 0–200)
HDL: 55.7 mg/dL (ref >39.00)
LDL CALC: 132 mg/dL — AB (ref 0–99)
NONHDL: 164.82
Total CHOL/HDL Ratio: 4
Triglycerides: 165 mg/dL — ABNORMAL HIGH (ref 0.0–149.0)
VLDL: 33 mg/dL (ref 0.0–40.0)

## 2015-03-12 LAB — COMPREHENSIVE METABOLIC PANEL
ALK PHOS: 60 U/L (ref 39–117)
ALT: 6 U/L (ref 0–35)
AST: 21 U/L (ref 0–37)
Albumin: 4.1 g/dL (ref 3.5–5.2)
BUN: 18 mg/dL (ref 6–23)
CO2: 29 meq/L (ref 19–32)
Calcium: 9.4 mg/dL (ref 8.4–10.5)
Chloride: 103 mEq/L (ref 96–112)
Creatinine, Ser: 0.83 mg/dL (ref 0.40–1.20)
GFR: 72.69 mL/min (ref >60.00)
GLUCOSE: 109 mg/dL — AB (ref 70–99)
POTASSIUM: 4 meq/L (ref 3.5–5.1)
SODIUM: 138 meq/L (ref 135–145)
TOTAL PROTEIN: 7 g/dL (ref 6.0–8.3)
Total Bilirubin: 0.5 mg/dL (ref 0.2–1.2)

## 2015-03-12 LAB — CBC WITH DIFFERENTIAL/PLATELET
BASOS PCT: 0.6 % (ref 0.0–3.0)
Basophils Absolute: 0 10*3/uL (ref 0.0–0.1)
EOS PCT: 2.6 % (ref 0.0–5.0)
Eosinophils Absolute: 0.1 10*3/uL (ref 0.0–0.7)
HCT: 41.5 % (ref 36.0–46.0)
Hemoglobin: 13.6 g/dL (ref 12.0–15.0)
LYMPHS ABS: 1.5 10*3/uL (ref 0.7–4.0)
Lymphocytes Relative: 26.9 % (ref 12.0–46.0)
MCHC: 32.7 g/dL (ref 30.0–36.0)
MCV: 90.9 fl (ref 78.0–100.0)
MONO ABS: 0.4 10*3/uL (ref 0.1–1.0)
MONOS PCT: 7.3 % (ref 3.0–12.0)
NEUTROS ABS: 3.6 10*3/uL (ref 1.4–7.7)
NEUTROS PCT: 62.6 % (ref 43.0–77.0)
PLATELETS: 192 10*3/uL (ref 150.0–400.0)
RBC: 4.57 Mil/uL (ref 3.87–5.11)
RDW: 13.7 % (ref 11.5–15.5)
WBC: 5.7 10*3/uL (ref 4.0–10.5)

## 2015-03-12 LAB — MICROALBUMIN / CREATININE URINE RATIO
Creatinine,U: 31.6 mg/dL
Microalb Creat Ratio: 2.2 mg/g (ref 0.0–30.0)
Microalb, Ur: <0.7 mg/dL (ref 0.0–1.9)

## 2015-03-12 LAB — TSH: TSH: 1.99 u[IU]/mL (ref 0.35–4.50)

## 2015-03-12 MED ORDER — LEVOTHYROXINE SODIUM 125 MCG PO TABS: 125.0000 ug | ORAL_TABLET | Freq: Every day | ORAL | Status: AC

## 2015-03-12 NOTE — Assessment & Plan Note (Signed)
BP Readings from Last 3 Encounters:  03/12/15 118/64  01/15/15 115/63  09/08/14 131/66   BP well controlled. Renal function with labs.

## 2015-03-12 NOTE — Progress Notes (Signed)
Annual Wellness Visit as completed by Health Coach was reviewed in full.  

## 2015-03-12 NOTE — Progress Notes (Signed)
Subjective:    Patient ID: Christina [name], female    DOB: 1945/10/08, 70 y.o.   MRN: [id number]  HPI  70YO female presents for follow up.  Last seen by me in 12/2014 for bronchitis.  Feeling well. Notes some urinary incontinence with coughing or sneezing. Worse with recent upper respiratory illness. No dysuria or frequency. Wearing pad during day. Has not tried any medication to help with symptoms.  Parkinsons - Seen by neurology in 12/2014. No changes made to medication. Symptoms have been stable. Tremor well controlled with medication.    Wt Readings from Last 3 Encounters:  03/12/15 190 lb 4 oz (86.297 kg)  01/15/15 186 lb 6 oz (84.539 kg)  09/08/14 181 lb (82.101 kg)   BP Readings from Last 3 Encounters:  03/12/15 118/64  01/15/15 115/63  09/08/14 131/66    Past Medical History  Diagnosis Date  . Vertigo   . Sinus infection   . Thyroid disease     hypothyroidism  . Diverticula of intestine    Family History  Problem Relation Age of Onset  . Lung cancer Mother   . Diabetes Mother   . Hypertension Mother   . Cancer Mother     lung  . Lung cancer Father   . Cancer Father     lung  . Diabetes Maternal Grandmother    Past Surgical History  Procedure Laterality Date  . Tubal ligation  1988  . Thyroidectomy  1976  . Gallbladder surgery  2005  . Vaginal delivery     Social History   Social History  . Marital Status: Married    Spouse Name: N/A  . Number of Children: 2  . Years of Education: N/A   Occupational History  . 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     Social History Main Topics  . Smoking status: Never Smoker   . Smokeless tobacco: Never Used  . Alcohol Use: 0.0 oz/week    0 Standard drinks or equivalent per week     Comment: occasional  . Drug Use: No  . Sexual Activity: Not Asked   Other Topics Concern  . None   Social History Narrative   Regular Exercise -  YES, twice a week   Daily Caffeine Use:  2 cups  coffee   Lives with husband in [place]. No pets. Work [employer] in [employer].     Review of Systems  Constitutional: Negative for fever, chills, appetite change, fatigue and unexpected weight change.  Eyes: Negative for visual disturbance.  Respiratory: Negative for cough, chest tightness and shortness of breath.   Cardiovascular: Negative for chest pain and leg swelling.  Gastrointestinal: Negative for nausea, vomiting, abdominal pain, diarrhea and constipation.  Genitourinary: Negative for dysuria, urgency, frequency, decreased urine volume, difficulty urinating, vaginal pain and pelvic pain.  Musculoskeletal: Negative for myalgias and arthralgias.  Skin: Negative for color change and rash.  Hematological: Negative for adenopathy. Does not bruise/bleed easily.  Psychiatric/Behavioral: Negative for sleep disturbance and dysphoric mood. The patient is not nervous/anxious.        Objective:    BP 118/64 mmHg  Pulse 60  Temp(Src) 97.1 F (36.2 C) (Oral)  Ht 5' 4.1" (1.628 m)  Wt 190 lb 4 oz (86.297 kg)  BMI 32.56 kg/m2  SpO2 96% Physical Exam  Constitutional: She is oriented to person, place, and time. She appears well-developed and well-nourished. No distress.  HENT:  Head: Normocephalic and atraumatic.  Right Ear:  External ear normal.  Left Ear: External ear normal.  Nose: Nose normal.  Mouth/Throat: Oropharynx is clear and moist. No oropharyngeal exudate.  Eyes: Conjunctivae are normal. Pupils are equal, round, and reactive to light. Right eye exhibits no discharge. Left eye exhibits no discharge. No scleral icterus.  Neck: Normal range of motion. Neck supple. No tracheal deviation present. No thyromegaly present.  Cardiovascular: Normal rate, regular rhythm, normal heart sounds and intact distal pulses.  Exam reveals no gallop and no friction rub.   No murmur heard. Pulmonary/Chest: Effort normal and breath sounds normal. No respiratory distress. She has no wheezes. She has no rales. She  exhibits no tenderness.  Musculoskeletal: Normal range of motion. She exhibits no edema or tenderness.  Lymphadenopathy:    She has no cervical adenopathy.  Neurological: She is alert and oriented to person, place, and time. No cranial nerve deficit. She exhibits normal muscle tone. Coordination normal.  Skin: Skin is warm and dry. No rash noted. She is not diaphoretic. No erythema. No pallor.  Psychiatric: She has a normal mood and affect. Her behavior is normal. Judgment and thought content normal.          Assessment & Plan:   Problem List Items Addressed This Visit      Unprioritized   Hypertension    BP Readings from Last 3 Encounters:  03/12/15 118/64  01/15/15 115/63  09/08/14 131/66   BP well controlled. Renal function with labs.      Hypothyroidism    Will check TSH with labs. Continue Levothyroxine.      Relevant Medications   levothyroxine (SYNTHROID, LEVOTHROID) 125 MCG tablet   Other Relevant Orders   TSH   PD (Parkinson's disease) (HCC) - Primary    Symptoms well controlled on Sinemet. Reviewed notes from neurology. Will follow.      Relevant Orders   Comprehensive metabolic panel   CBC with Differential/Platelet   Microalbumin / creatinine urine ratio   Lipid panel   Urinary, incontinence, stress female    Symptoms c/w stress incontinence. Discussed Kegel's. Discussed adding medication to help with symptoms, however given potential side effects of medication will hold for now. We also discussed potential referral to Urology if symptoms persistent. Follow up prn and in 6 months.       Other Visit Diagnoses    Need for hepatitis C screening test        Relevant Orders    Hepatitis C antibody        Return in about 6 months (around 09/09/2015) for Recheck.

## 2015-03-12 NOTE — Progress Notes (Signed)
Subjective:   Christina [name] is a 70 y.o. female who presents for an Initial Medicare Annual Wellness Visit.  Review of Systems    No ROS.  Medicare Wellness Visit.  Cardiac Risk Factors include: advanced age (>61men, >49 women);hypertension     Objective:    Today's Vitals   03/12/15 1025  BP: 118/64  Pulse: 60  Temp: 97.1 F (36.2 C)  TempSrc: Oral  Height: 5' 4.1" (1.628 m)  Weight: 190 lb 4 oz (86.297 kg)  SpO2: 96%    Current Medications (verified) Outpatient Encounter Prescriptions as of 03/12/2015  Medication Sig  . Alpha-Lipoic Acid 200 MG CAPS Take 600 mg by mouth daily.  Marland Kitchen ALPRAZolam (XANAX) 0.25 MG tablet Take 1 tablet (0.25 mg total) by mouth 2 (two) times daily as needed for anxiety.  . calcium carbonate (OS-CAL) 600 MG TABS Take 600 mg by mouth daily.    . carbidopa-levodopa (SINEMET IR) 25-100 MG tablet Take by mouth.  . chlorpheniramine-HYDROcodone (TUSSIONEX PENNKINETIC ER) 10-8 MG/5ML SUER Take 5 mLs by mouth every 12 (twelve) hours as needed.  . cycloSPORINE (RESTASIS) 0.05 % ophthalmic emulsion Place 1 drop into both eyes 2 (two) times daily.  . DHA-EPA-Flaxseed Oil-Vitamin E (THERA TEARS NUTRITION PO) Take by mouth 2 (two) times daily.  . furosemide (LASIX) 20 MG tablet Take 1 tablet (20 mg total) by mouth daily.  Marland Kitchen levothyroxine (SYNTHROID, LEVOTHROID) 125 MCG tablet Take 1 tablet (125 mcg total) by mouth daily.  Marland Kitchen losartan-hydrochlorothiazide (HYZAAR) 50-12.5 MG tablet TAKE ONE TABLET BY MOUTH ONCE DAILY  . Omega-3 Fatty Acids (FISH OIL TRIPLE STRENGTH) 1400 MG CAPS Take by mouth daily.    . traZODone (DESYREL) 100 MG tablet Take by mouth.  . [DISCONTINUED] levothyroxine (SYNTHROID, LEVOTHROID) 125 MCG tablet TAKE ONE TABLET BY MOUTH ONCE DAILY  . B Complex-C-Folic Acid (MULTIVITAMIN, STRESS FORMULA) tablet Take 1 tablet by mouth daily. Reported on 03/12/2015  . fexofenadine (ALLEGRA) 180 MG tablet Take 1 tablet (180 mg total) by mouth daily.  .  Multiple Vitamin (MULTIVITAMIN) tablet Take 1 tablet by mouth daily. Reported on 03/12/2015  . pramipexole (MIRAPEX) 0.25 MG tablet Take 0.125 mg by mouth daily.  . [DISCONTINUED] doxycycline (VIBRA-TABS) 100 MG tablet Take 1 tablet (100 mg total) by mouth 2 (two) times daily. (Patient not taking: Reported on 03/12/2015)  . [DISCONTINUED] fluconazole (DIFLUCAN) 150 MG tablet Take 1 tablet (150 mg total) by mouth once. (Patient not taking: Reported on 03/12/2015)   No facility-administered encounter medications on file as of 03/12/2015.    Allergies (verified) Lisinopril-hydrochlorothiazide and Penicillins   History: Past Medical History  Diagnosis Date  . Vertigo   . Sinus infection   . Thyroid disease     hypothyroidism  . Diverticula of intestine    Past Surgical History  Procedure Laterality Date  . Tubal ligation  1988  . Thyroidectomy  1976  . Gallbladder surgery  2005  . Vaginal delivery     Family History  Problem Relation Age of Onset  . Lung cancer Mother   . Diabetes Mother   . Hypertension Mother   . Cancer Mother     lung  . Lung cancer Father   . Cancer Father     lung  . Diabetes Maternal Grandmother    Social History   Occupational History  . 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     Social History Main Topics  . Smoking status: Never Smoker   .  Smokeless tobacco: Never Used  . Alcohol Use: 0.0 oz/week    0 Standard drinks or equivalent per week     Comment: occasional  . Drug Use: No  . Sexual Activity: Not Currently    Tobacco Counseling Counseling given: Not Answered   Activities of Daily Living In your present state of health, do you have any difficulty performing the following activities: 03/12/2015  Hearing? N  Vision? N  Difficulty concentrating or making decisions? N  Walking or climbing stairs? N  Dressing or bathing? N  Doing errands, shopping? N  Preparing Food and eating ? N  Using the Toilet? N  In the past six  months, have you accidently leaked urine? N  Do you have problems with loss of bowel control? N  Managing your Medications? N  Managing your Finances? N  Housekeeping or managing your Housekeeping? N    Immunizations and Health Maintenance Immunization History  Administered Date(s) Administered  . Influenza Split 12/05/2011, 11/17/2012  . Influenza-Unspecified 11/17/2013, 11/18/2014  . Pneumococcal Conjugate-13 03/15/2013  . Pneumococcal Polysaccharide-23 03/12/2015  . Tdap 06/25/2007  . Zoster 06/25/2007   There are no preventive care reminders to display for this patient.  Patient Care Team: Shelia Media, MD as PCP - General (Internal Medicine)  Indicate any recent Medical Services you may have received from other than [facility] providers in the past year (date may be approximate).     Assessment:   This is a routine wellness examination for Christina [name]. The goal of the wellness visit is to assist the patient how to close the gaps in care and create a preventative care plan for the patient.   VIT D Calcium as appropriate/ Osteoporosis risk reviewed.  Medications reviewed; taking without issues or barriers.  Safety issues reviewed; smoke detectors in the home. Firearms locked in a secure area in the home. Wears seatbelts when driving or riding with others. No violence in the home.  No identified risk were noted; The patient was oriented x 3; appropriate in dress and manner and no objective failures at ADL's or IADL's.   Patient Concerns:  None at this time.  Follow up with PCP as needed.  Paralysis agitans-stable and followed by Dr. Sherryll Burger Parkinson's disease-stable and followed by Dr. Sherryll Burger  Hearing/Vision screen Hearing Screening Comments: Passes the whisper test Vision Screening Comments: Followed by [facility] Chronic eye dryness Last OV 10/2014   Dietary issues and exercise activities discussed: Current Exercise Habits:: Structured exercise class (Attends  gym 2-3 days weekly), Type of exercise: walking, Time (Minutes): 60, Frequency (Times/Week): 4, Weekly Exercise (Minutes/Week): 240, Intensity: Mild  Goals    . Healthy Lifestyle     Stay hydrated, continue drinking plenty of fluids Choose lean meats, fruits and vegetables with meals Maintain exercise regiment      Depression Screen PHQ 2/9 Scores 03/12/2015  PHQ - 2 Score 0    Fall Risk Fall Risk  03/12/2015  Falls in the past year? No    Cognitive Function: MMSE - Mini Mental State Exam 03/12/2015  Orientation to time 5  Orientation to Place 5  Registration 3  Attention/ Calculation 5  Recall 3  Language- name 2 objects 2  Language- repeat 1  Language- follow 3 step command 3  Language- read & follow direction 1  Write a sentence 0  Write a sentence-comments Difficulty gripping writing utensil.  Copy design 1  Total score 29    Screening Tests Health Maintenance  Topic Date Due  .  INFLUENZA VACCINE  09/18/2015  . MAMMOGRAM  02/28/2016  . TETANUS/TDAP  06/24/2017  . COLONOSCOPY  07/02/2018  . DEXA SCAN  Completed  . ZOSTAVAX  Completed  . Hepatitis C Screening  Completed  . PNA vac Low Risk Adult  Completed      Plan:    End of life planning; Advance aging; Advanced directives discussed.  HCPOA/Living Will paperwork in progress with family.  Encouraged to bring a copy when completed.  During the course of the visit, Chanley was educated and counseled about the following appropriate screening and preventive services:   Vaccines to include Pneumoccal, Influenza, Hepatitis B, Td, Zostavax, HCV  Electrocardiogram  Cardiovascular disease screening  Colorectal cancer screening  Bone density screening  Diabetes screening  Glaucoma screening  Mammography/PAP  Nutrition counseling  Smoking cessation counseling  Patient Instructions (the written plan) were given to the patient.    Ashok Pall, LPN   1/[id number]

## 2015-03-12 NOTE — Assessment & Plan Note (Signed)
Symptoms c/w stress incontinence. Discussed Kegel's. Discussed adding medication to help with symptoms, however given potential side effects of medication will hold for now. We also discussed potential referral to Urology if symptoms persistent. Follow up prn and in 6 months.

## 2015-03-12 NOTE — Patient Instructions (Signed)
Labs today.  Follow up 6 months. 

## 2015-03-12 NOTE — Progress Notes (Signed)
Pre visit review using our clinic review tool, if applicable. No additional management support is needed unless otherwise documented below in the visit note. 

## 2015-03-12 NOTE — Assessment & Plan Note (Signed)
Will check TSH with labs. Continue Levothyroxine. 

## 2015-03-12 NOTE — Assessment & Plan Note (Signed)
Symptoms well controlled on Sinemet. Reviewed notes from neurology. Will follow.

## 2015-03-13 LAB — HEPATITIS C ANTIBODY: HCV AB: NEGATIVE

## 2015-04-20 ENCOUNTER — Encounter: Payer: Self-pay | Admitting: Internal Medicine

## 2015-06-04 ENCOUNTER — Other Ambulatory Visit: Payer: Self-pay | Admitting: Internal Medicine

## 2015-06-25 ENCOUNTER — Observation Stay
Admission: EM | Admit: 2015-06-25 | Discharge: 2015-06-26 | Disposition: A | Discharge status: Home / Self Care | Payer: Medicare HMO | Attending: Internal Medicine | Admitting: Internal Medicine

## 2015-06-25 ENCOUNTER — Encounter: Payer: Self-pay | Admitting: Emergency Medicine

## 2015-06-25 ENCOUNTER — Emergency Department: Payer: Medicare HMO

## 2015-06-25 ENCOUNTER — Observation Stay (HOSPITAL_BASED_OUTPATIENT_CLINIC_OR_DEPARTMENT_OTHER)
Admit: 2015-06-25 | Discharge: 2015-06-25 | Disposition: A | Discharge status: Home / Self Care | Payer: Medicare HMO | Attending: Internal Medicine | Admitting: Internal Medicine

## 2015-06-25 ENCOUNTER — Other Ambulatory Visit: Payer: Self-pay

## 2015-06-25 ENCOUNTER — Ambulatory Visit (INDEPENDENT_AMBULATORY_CARE_PROVIDER_SITE_OTHER)
Admission: EM | Admit: 2015-06-25 | Discharge: 2015-06-25 | Disposition: A | Discharge status: Other Acute Inpatient Hospital | Payer: Medicare HMO | Source: Home / Self Care | Attending: Family Medicine | Admitting: Family Medicine

## 2015-06-25 DIAGNOSIS — J309 Allergic rhinitis, unspecified: Secondary | ICD-10-CM | POA: Insufficient documentation

## 2015-06-25 DIAGNOSIS — I447 Left bundle-branch block, unspecified: Secondary | ICD-10-CM | POA: Diagnosis not present

## 2015-06-25 DIAGNOSIS — I739 Peripheral vascular disease, unspecified: Secondary | ICD-10-CM | POA: Insufficient documentation

## 2015-06-25 DIAGNOSIS — R55 Syncope and collapse: Secondary | ICD-10-CM | POA: Diagnosis not present

## 2015-06-25 DIAGNOSIS — E039 Hypothyroidism, unspecified: Secondary | ICD-10-CM | POA: Insufficient documentation

## 2015-06-25 DIAGNOSIS — R001 Bradycardia, unspecified: Secondary | ICD-10-CM

## 2015-06-25 DIAGNOSIS — Z888 Allergy status to other drugs, medicaments and biological substances status: Secondary | ICD-10-CM | POA: Diagnosis not present

## 2015-06-25 DIAGNOSIS — I10 Essential (primary) hypertension: Secondary | ICD-10-CM | POA: Insufficient documentation

## 2015-06-25 DIAGNOSIS — I440 Atrioventricular block, first degree: Secondary | ICD-10-CM | POA: Diagnosis not present

## 2015-06-25 DIAGNOSIS — G20A1 Parkinson's disease without dyskinesia, without mention of fluctuations: Secondary | ICD-10-CM | POA: Insufficient documentation

## 2015-06-25 DIAGNOSIS — I455 Other specified heart block: Secondary | ICD-10-CM | POA: Diagnosis not present

## 2015-06-25 DIAGNOSIS — E860 Dehydration: Secondary | ICD-10-CM | POA: Insufficient documentation

## 2015-06-25 DIAGNOSIS — Z880 Allergy status to penicillin: Secondary | ICD-10-CM | POA: Insufficient documentation

## 2015-06-25 DIAGNOSIS — G20 Parkinson's disease: Secondary | ICD-10-CM | POA: Diagnosis not present

## 2015-06-25 DIAGNOSIS — I495 Sick sinus syndrome: Secondary | ICD-10-CM | POA: Insufficient documentation

## 2015-06-25 HISTORY — DX: Essential (primary) hypertension: I10

## 2015-06-25 HISTORY — DX: Parkinson's disease without dyskinesia, without mention of fluctuations: G20.A1

## 2015-06-25 HISTORY — DX: Hypothyroidism, unspecified: E03.9

## 2015-06-25 HISTORY — DX: Parkinson's disease: G20

## 2015-06-25 LAB — CBC
HCT: 41.3 % (ref 35.0–47.0)
Hemoglobin: 13.9 g/dL (ref 12.0–16.0)
MCH: 29.9 pg (ref 26.0–34.0)
MCHC: 33.6 g/dL (ref 32.0–36.0)
MCV: 88.9 fL (ref 80.0–100.0)
PLATELETS: 180 10*3/uL (ref 150–440)
RBC: 4.64 MIL/uL (ref 3.80–5.20)
RDW: 14 % (ref 11.5–14.5)
WBC: 6.9 10*3/uL (ref 3.6–11.0)

## 2015-06-25 LAB — BASIC METABOLIC PANEL
Anion gap: 8 (ref 5–15)
BUN: 29 mg/dL — AB (ref 6–20)
CALCIUM: 9.4 mg/dL (ref 8.9–10.3)
CO2: 28 mmol/L (ref 22–32)
CREATININE: 1.05 mg/dL — AB (ref 0.44–1.00)
Chloride: 101 mmol/L (ref 101–111)
GFR calc Af Amer: >60 mL/min (ref >60)
GFR, EST NON AFRICAN AMERICAN: 53 mL/min — AB (ref >60)
GLUCOSE: 135 mg/dL — AB (ref 65–99)
Potassium: 4.1 mmol/L (ref 3.5–5.1)
Sodium: 137 mmol/L (ref 135–145)

## 2015-06-25 LAB — TROPONIN I

## 2015-06-25 LAB — TSH: TSH: 1.827 u[IU]/mL (ref 0.350–4.500)

## 2015-06-25 MED ORDER — SODIUM CHLORIDE 0.9 % IV SOLN
INTRAVENOUS | Status: DC
  Administered 2015-06-25 – 2015-06-26 (×2): via INTRAVENOUS

## 2015-06-25 MED ORDER — LEVOTHYROXINE SODIUM 125 MCG PO TABS
125.0000 ug | ORAL_TABLET | Freq: Every day | ORAL | Status: DC
  Administered 2015-06-26: 125 ug via ORAL
  Filled 2015-06-25: qty 1

## 2015-06-25 MED ORDER — ENOXAPARIN SODIUM 40 MG/0.4ML ~~LOC~~ SOLN
40.0000 mg | SUBCUTANEOUS | Status: DC
  Administered 2015-06-25 – 2015-06-26 (×2): 40 mg via SUBCUTANEOUS
  Filled 2015-06-25 (×2): qty 0.4

## 2015-06-25 MED ORDER — ONDANSETRON HCL 4 MG PO TABS
4.0000 mg | ORAL_TABLET | Freq: Four times a day (QID) | ORAL | Status: DC | PRN
Start: 2015-06-25 — End: 2015-06-26

## 2015-06-25 MED ORDER — OXYCODONE HCL 5 MG PO TABS: 5.0000 mg | ORAL_TABLET | ORAL | Status: DC | PRN

## 2015-06-25 MED ORDER — ACETAMINOPHEN 325 MG PO TABS
650.0000 mg | ORAL_TABLET | Freq: Four times a day (QID) | ORAL | Status: DC | PRN
  Administered 2015-06-25: 650 mg via ORAL
  Filled 2015-06-25: qty 2

## 2015-06-25 MED ORDER — LIFITEGRAST 5 % OP SOLN: 1.0000 [drp] | Freq: Two times a day (BID) | OPHTHALMIC | Status: DC

## 2015-06-25 MED ORDER — PRAMIPEXOLE DIHYDROCHLORIDE 0.25 MG PO TABS
0.2500 mg | ORAL_TABLET | Freq: Every day | ORAL | Status: DC
  Administered 2015-06-25: 0.25 mg via ORAL
  Filled 2015-06-25 (×3): qty 1

## 2015-06-25 MED ORDER — ADULT MULTIVITAMIN W/MINERALS CH
1.0000 | ORAL_TABLET | Freq: Every day | ORAL | Status: DC
  Administered 2015-06-26: 1 via ORAL
  Filled 2015-06-25: qty 1

## 2015-06-25 MED ORDER — PRAMIPEXOLE DIHYDROCHLORIDE 0.25 MG PO TABS
0.1250 mg | ORAL_TABLET | Freq: Two times a day (BID) | ORAL | Status: DC
  Administered 2015-06-26 (×2): 0.125 mg via ORAL
  Filled 2015-06-25: qty 1

## 2015-06-25 MED ORDER — PRAMIPEXOLE DIHYDROCHLORIDE 0.125 MG PO TABS
0.1250 mg | ORAL_TABLET | Freq: Two times a day (BID) | ORAL | Status: DC
  Administered 2015-06-25: 0.125 mg via ORAL
  Filled 2015-06-25 (×2): qty 1

## 2015-06-25 MED ORDER — OMEGA-3-ACID ETHYL ESTERS 1 G PO CAPS
1.0000 | ORAL_CAPSULE | Freq: Every day | ORAL | Status: DC
Start: 2015-06-25 — End: 2015-06-26
  Administered 2015-06-26: 1 g via ORAL
  Filled 2015-06-25: qty 1

## 2015-06-25 MED ORDER — POLYVINYL ALCOHOL 1.4 % OP SOLN
1.0000 [drp] | Freq: Two times a day (BID) | OPHTHALMIC | Status: DC | PRN
  Filled 2015-06-25: qty 15

## 2015-06-25 MED ORDER — ONDANSETRON HCL 4 MG/2ML IJ SOLN: 4.0000 mg | Freq: Four times a day (QID) | INTRAMUSCULAR | Status: DC | PRN

## 2015-06-25 MED ORDER — TRAZODONE HCL 100 MG PO TABS
100.0000 mg | ORAL_TABLET | Freq: Every day | ORAL | Status: DC
  Administered 2015-06-25: 100 mg via ORAL
  Filled 2015-06-25: qty 1

## 2015-06-25 MED ORDER — ACETAMINOPHEN 650 MG RE SUPP: 650.0000 mg | Freq: Four times a day (QID) | RECTAL | Status: DC | PRN

## 2015-06-25 MED ORDER — CALCIUM CARBONATE ANTACID 500 MG PO CHEW
500.0000 mg | CHEWABLE_TABLET | Freq: Every day | ORAL | Status: DC
  Administered 2015-06-25 – 2015-06-26 (×2): 500 mg via ORAL
  Filled 2015-06-25 (×2): qty 1

## 2015-06-25 MED ORDER — SODIUM CHLORIDE 0.9% FLUSH: 3.0000 mL | Freq: Two times a day (BID) | INTRAVENOUS | Status: DC

## 2015-06-25 MED ORDER — CARBIDOPA-LEVODOPA 25-100 MG PO TABS
2.0000 | ORAL_TABLET | Freq: Three times a day (TID) | ORAL | Status: DC
  Administered 2015-06-25 – 2015-06-26 (×4): 2 via ORAL
  Filled 2015-06-25 (×4): qty 2

## 2015-06-25 MED ORDER — LORATADINE 10 MG PO TABS
10.0000 mg | ORAL_TABLET | Freq: Every day | ORAL | Status: DC
  Administered 2015-06-26: 10 mg via ORAL
  Filled 2015-06-25: qty 1

## 2015-06-25 MED ORDER — CEFOL PO TABS
1.0000 | ORAL_TABLET | Freq: Every day | ORAL | Status: DC
Start: 2015-06-25 — End: 2015-06-25

## 2015-06-25 MED ORDER — ONDANSETRON 8 MG PO TBDP
8.0000 mg | ORAL_TABLET | Freq: Once | ORAL | Status: AC
  Administered 2015-06-25: 8 mg via ORAL

## 2015-06-25 MED ORDER — ASPIRIN EC 81 MG PO TBEC
81.0000 mg | DELAYED_RELEASE_TABLET | Freq: Every day | ORAL | Status: DC
  Administered 2015-06-25 – 2015-06-26 (×2): 81 mg via ORAL
  Filled 2015-06-25 (×2): qty 1

## 2015-06-25 MED ORDER — MORPHINE SULFATE (PF) 2 MG/ML IV SOLN: 2.0000 mg | INTRAVENOUS | Status: DC | PRN

## 2015-06-25 MED ORDER — ALPHA-LIPOIC ACID 200 MG PO CAPS: 200.0000 mg | ORAL_CAPSULE | Freq: Three times a day (TID) | ORAL | Status: DC

## 2015-06-25 NOTE — Plan of Care (Signed)
Problem: Safety: Goal: Ability to remain free from injury will improve Outcome: Progressing Pt proficiently able to demonstrate how to call nursing staff by calling RN for assistance to the bathroom.

## 2015-06-25 NOTE — Care Management Note (Signed)
Case Management Note  Patient Details  Name: Christina [name] MRN: [id number] Date of Birth: Nov 11, 1945  Subjective/Objective:    70yo Christina [name] was admitted 06/25/15 after [name] syncopal episode at home during which she fell. Dx: Bradycardia. Hx: Vertigo, Diverticulosis, Hypothyroidism, Parkinson's diseae. Wearing [name] C-collar per complains of neck pain after her fall. PCP= Ronna PolioJennifer Walker. Resides at home with her husband. Cardiology Consult. Case management will follow for discharge planning. Possible Pacemaker?                 Action/Plan:   Expected Discharge Date:                  Expected Discharge Plan:     In-House Referral:     Discharge planning Services     Post Acute Care Choice:    Choice offered to:     DME Arranged:    DME Agency:     HH Arranged:    HH Agency:     Status of Service:     Medicare Important Message Given:    Date Medicare IM Given:    Medicare IM give by:    Date Additional Medicare IM Given:    Additional Medicare Important Message give by:     If discussed at Long Length of Stay Meetings, dates discussed:    Additional Comments:  Christina Fullen A, RN 06/25/2015, 4:43 PM

## 2015-06-25 NOTE — ED Provider Notes (Signed)
[facility]  ____________________________________________  Time seen: Approximately 11:41 AM  I have reviewed the triage vital signs and the nursing notes.   HISTORY   Chief Complaint Loss of Consciousness  HPI Christina [name] is a 71 y.o. female presents with husband at bedside for the complaint of syncope. Patient and husband reports that this morning started out as normal including they ate breakfast and then went outside to work in the yard. Denies strenuous activity. Reports as they were walking and husband states that he looked behind her and and assault patient fall backwards. Patient husband reports that she did not appear to try to brace herself but states that she fell directly back and hit her head on the grass. Patient states that patient was blacked out for approximately 10 seconds stating that she was "not with it". Husband states that she did not appear to try to catch herself or stop herself from falling. Husband states that she may have hit her back prior to hitting her head on the grass.  Reports that this event occurred at 10:30 this morning. Patient denies any preceding symptoms. Denies any recent sickness. Denies history of similar. Patient reports that she does have a history of vertigo but states that she always has dizziness and nausea with her vertigo and has never passed out. Denies any nausea or dizziness prior to syncopal event. Also denies any chest pain and shortness of breath or weakness feelings prior to.  Denies chest pain, shortness of breath, abdominal pain, dysuria, back pain. Patient reports that she does currently have a mild headache and intermittent shooting lower neck pain. Denies extremity pain. Patient and husband reports patient has been ambulatory since the event. Husband states that patient still seems slightly out of it but overall near baseline. Denies any recent changes in medications. Denies beta blockers or calcium channel blockers.    PCP: Dan Humphreys   Past Medical History  Diagnosis Date  . Vertigo   . Sinus infection   . Thyroid disease     hypothyroidism  . Diverticula of intestine     Patient Active Problem List   Diagnosis Date Noted  . Urinary, incontinence, stress female 03/12/2015  . Edema 08/02/2014  . Bilateral leg numbness 05/15/2014  . Left sciatic nerve pain 04/13/2014  . Diverticulitis of colon 01/16/2014  . Medicare annual wellness visit, subsequent 07/08/2013  . Insomnia 01/31/2013  . PD (Parkinson's disease) (HCC) 01/05/2013  . Screening for breast cancer 12/31/2012  . Anemia 12/31/2012  . Hypothyroidism 07/02/2011  . Hypertension 07/02/2011  . Allergic rhinitis 12/23/2010    Past Surgical History  Procedure Laterality Date  . Tubal ligation  1988  . Thyroidectomy  1976  . Gallbladder surgery  2005  . Vaginal delivery      Current Outpatient Rx  Name  Route  Sig  Dispense  Refill  . Alpha-Lipoic Acid 200 MG CAPS   Oral   Take 600 mg by mouth daily.         Marland Kitchen ALPRAZolam (XANAX) 0.25 MG tablet   Oral   Take 1 tablet (0.25 mg total) by mouth 2 (two) times daily as needed for anxiety.   20 tablet   0   . B Complex-C-Folic Acid (MULTIVITAMIN, STRESS FORMULA) tablet   Oral   Take 1 tablet by mouth daily. Reported on 03/12/2015         . calcium carbonate (OS-CAL) 600 MG TABS   Oral   Take 600 mg by mouth  daily.           . carbidopa-levodopa (SINEMET IR) 25-100 MG tablet   Oral   Take by mouth.         . chlorpheniramine-HYDROcodone (TUSSIONEX PENNKINETIC ER) 10-8 MG/5ML SUER   Oral   Take 5 mLs by mouth every 12 (twelve) hours as needed.   140 mL   0   . cycloSPORINE (RESTASIS) 0.05 % ophthalmic emulsion   Both Eyes   Place 1 drop into both eyes 2 (two) times daily.         . DHA-EPA-Flaxseed Oil-Vitamin E (THERA TEARS NUTRITION PO)   Oral   Take by mouth 2 (two) times daily.         Marland Kitchen EXPIRED: fexofenadine (ALLEGRA) 180 MG tablet   Oral   Take 1  tablet (180 mg total) by mouth daily.   90 tablet   4   . furosemide (LASIX) 20 MG tablet   Oral   Take 1 tablet (20 mg total) by mouth daily.   30 tablet   3   . levothyroxine (SYNTHROID, LEVOTHROID) 125 MCG tablet   Oral   Take 1 tablet (125 mcg total) by mouth daily.   90 tablet   3   . losartan-hydrochlorothiazide (HYZAAR) 50-12.5 MG tablet      TAKE ONE TABLET BY MOUTH ONCE DAILY   90 tablet   3   . Multiple Vitamin (MULTIVITAMIN) tablet   Oral   Take 1 tablet by mouth daily. Reported on 03/12/2015         . Omega-3 Fatty Acids (FISH OIL TRIPLE STRENGTH) 1400 MG CAPS   Oral   Take by mouth daily.           Marland Kitchen EXPIRED: pramipexole (MIRAPEX) 0.25 MG tablet   Oral   Take 0.125 mg by mouth daily.         . traZODone (DESYREL) 100 MG tablet   Oral   Take by mouth.           Allergies Lisinopril-hydrochlorothiazide and Penicillins  Family History  Problem Relation Age of Onset  . Lung cancer Mother   . Diabetes Mother   . Hypertension Mother   . Cancer Mother     lung  . Lung cancer Father   . Cancer Father     lung  . Diabetes Maternal Grandmother     Social History Social History  Substance Use Topics  . Smoking status: Never Smoker   . Smokeless tobacco: Never Used  . Alcohol Use: 0.0 oz/week    0 Standard drinks or equivalent per week     Comment: occasional    Review of Systems Constitutional: No fever/chills Eyes: No visual changes. ENT: No sore throat. Cardiovascular: Denies chest pain. Respiratory: Denies shortness of breath. Gastrointestinal: No abdominal pain.  No nausea, no vomiting.  No diarrhea.  No constipation. Genitourinary: Negative for dysuria. Musculoskeletal: Negative for back pain. Skin: Negative for rash. Neurological: Negative focal weakness or numbness.  10-point ROS otherwise negative.  ____________________________________________   PHYSICAL EXAM:  VITAL SIGNS: ED Triage Vitals  Enc Vitals Group      BP 06/25/15 1108 178/77 mmHg     Pulse Rate 06/25/15 1108 48     Resp 06/25/15 1108 20     Temp --      Temp src --      SpO2 06/25/15 1108 95 %     Weight --      Height --  Head Cir --      Peak Flow --      Pain Score --      Pain Loc --      Pain Edu? --      Excl. in GC? --     Constitutional: Alert and oriented. Well appearing and in no acute distress. Eyes: Conjunctivae are normal. PERRL. EOMI. No pain with EOMs. Head: Mild posterior facet tenderness palpation. No erythema or ecchymosis. Otherwise nontender..   Ears: no erythema, normal TMs bilaterally.   Nose: No congestion/rhinnorhea.  Mouth/Throat: Mucous membranes are moist.  Oropharynx non-erythematous. Neck: No stridor.  No cervical spine tenderness to palpation. Hematological/Lymphatic/Immunilogical: No cervical lymphadenopathy. Cardiovascular: Noted bradycardia otherwise grossly normal heart sounds.  Good peripheral circulation. Respiratory: Normal respiratory effort.  No retractions. Lungs CTAB. No wheezes, rales or rhonchi. Gastrointestinal: Soft and nontender.No CVA tenderness. Musculoskeletal: No lower or upper extremity tenderness nor edema. 5/5 strength bilateral upper and lower extremities with full range of motion and nontender. Bilateral pedal pulses equal and easily palpated. No midline thoracic or lumbar tenderness to palpation. Mild mid to lower midline cervical tenderness palpation, no erythema, no ecchymosis, no swelling. Neurologic:  Normal speech and language. No gross focal neurologic deficits are appreciated. No gait instability. Skin:  Skin is warm, dry and intact. No rash noted. Psychiatric: Mood and affect are normal. Speech and behavior are normal.  ____________________________________________   LABS (all labs ordered are listed, but only abnormal results are displayed)  Labs Reviewed - No data to display ____________________________________________  EKG  ED ECG REPORT I, Renford DillsLindsey  Rodd Heft, the attending provider, personally viewed and interpreted this ECG.   Date: 06/25/2015  EKG Time: 1105     Rate: 42  Rhythm: Marked sinus bradycardia with sinus arrhythmia with first-degree AV block  Axis: Normal  Intervals:left bundle branch block  ST&T Change: None    INITIAL IMPRESSION / ASSESSMENT AND PLAN / ED COURSE  Pertinent labs & imaging results that were available during my care of the patient were reviewed by me and considered in my medical decision making (see chart for details).  Overall well-appearing patient. Presenting with husband at bedside for the complaints of syncopal event. Unclear of precipitating events of syncope, except patient noted to bradycardic on arrival as well as husband stated when he checked her heart rate and blood pressure at home her heart rate was also in the 40s then. Patient now with headache and cervical tenderness. C-collar applied. Also complaining of some nausea with one episode of vomiting, Zofran ODT given. Discussed with patient and husband recommended for patient to be further evaluated in the emergency room at this time due to symptomatic bradycardia with syncope and collapse. EMS called. Patient stable at the time of transfer. Tammy SoursGreg RN charge nurse [facility] cautery report given.  Discussed follow up with Primary care physician this week. Discussed follow up and return parameters including no resolution or any worsening concerns. Patient verbalized understanding and agreed to plan.   ____________________________________________   FINAL CLINICAL IMPRESSION(S) / ED DIAGNOSES  Final diagnoses:  Symptomatic bradycardia  Syncope and collapse      Note: This dictation was prepared with Dragon dictation along with smaller phrase technology. Any transcriptional errors that result from this process are unintentional.    Renford DillsLindsey Evelene Roussin, NP 06/25/15 1610

## 2015-06-25 NOTE — Progress Notes (Signed)
PHARMACIST - PHYSICIAN ORDER COMMUNICATION  CONCERNING: P&T Medication Policy on Herbal Medications  DESCRIPTION:  This patient's order for:  Alpha Lipoic Acid  has been noted.  This product(s) is classified as an "herbal" or natural product. Due to a lack of definitive safety studies or FDA approval, nonstandard manufacturing practices, plus the potential risk of unknown drug-drug interactions while on inpatient medications, the Pharmacy and Therapeutics Committee does not permit the use of "herbal" or natural products of this type within [place].   ACTION TAKEN: The pharmacy department is unable to verify this order at this time and your patient has been informed of this safety policy. Please reevaluate patient's clinical condition at discharge and address if the herbal or natural product(s) should be resumed at that time.   

## 2015-06-25 NOTE — H&P (Signed)
[employer] [facility] [place] [facility]   PATIENT NAME: Christina [name]    MR#:  [id number]  DATE OF BIRTH:  09/29/1945   DATE OF ADMISSION:  06/25/2015  PRIMARY CARE PHYSICIAN: Wynona DoveWALKER,JENNIFER AZBELL, MD   REQUESTING/REFERRING PHYSICIAN: [email or web address]  CHIEF COMPLAINT:   Chief Complaint  Patient presents with  . Loss of Consciousness  . Fall    HISTORY OF PRESENT ILLNESS:  Christina Catholicancy Bettinger  is a 70 y.o. female with a known history of Hypothyroidism unspecified, Parkinson's who is presenting after syncopal episode. She states she's been feeling "swimmy headed" for about 2 days total duration she blames this on history of vertigo however her symptoms have been intermittent. She noticed this sensation more so when bending over or rising to a standing position quickly. Today she was working in the garden with her husband transplanting roses when she stood up and walked a few steps and then suddenly collapsed. The patient has no recollection of the event no preceding symptoms including aura palpitations chest pain and lightheadedness. This event was witnessed by her husband who denies any seizure-like activity. There was no postictal state. On arrival to the hospital she was noted to be bradycardic heart rate in the 40s for which she states was a common problem with her father currently she is back in normal sinus rhythm with heart rate 70s.  PAST MEDICAL HISTORY:   Past Medical History  Diagnosis Date  . Vertigo   . Sinus infection   . Thyroid disease     hypothyroidism  . Diverticula of intestine     PAST SURGICAL HISTORY:   Past Surgical History  Procedure Laterality Date  . Tubal ligation  1988  . Thyroidectomy  1976  . Gallbladder surgery  2005  . Vaginal delivery      SOCIAL HISTORY:   Social History  Substance Use Topics  . Smoking status: Never Smoker   . Smokeless tobacco: Never Used  . Alcohol Use: 0.0 oz/week    0 Standard drinks or equivalent per  week     Comment: occasional    FAMILY HISTORY:   Family History  Problem Relation Age of Onset  . Lung cancer Mother   . Diabetes Mother   . Hypertension Mother   . Cancer Mother     lung  . Lung cancer Father   . Cancer Father     lung  . Diabetes Maternal Grandmother     DRUG ALLERGIES:   Allergies  Allergen Reactions  . Lisinopril-Hydrochlorothiazide Other (See Comments)    Post nasal drainage  . Penicillins Rash    REVIEW OF SYSTEMS:  REVIEW OF SYSTEMS:  CONSTITUTIONAL: Denies fevers, chills, fatigue, weakness.  EYES: Denies blurred vision, double vision, or eye pain.  EARS, NOSE, THROAT: Denies tinnitus, ear pain, hearing loss.  RESPIRATORY: denies cough, shortness of breath, wheezing  CARDIOVASCULAR: Denies chest pain, palpitations, edema.  GASTROINTESTINAL: Denies nausea, vomiting, diarrhea, abdominal pain.  GENITOURINARY: Denies dysuria, hematuria.  ENDOCRINE: Denies nocturia or thyroid problems. HEMATOLOGIC AND LYMPHATIC: Denies easy bruising or bleeding.  SKIN: Denies rash or lesions.  MUSCULOSKELETAL: Denies pain in neck, back, shoulder, knees, hips, or further arthritic symptoms.  NEUROLOGIC: Denies paralysis, paresthesias.  PSYCHIATRIC: Denies anxiety or depressive symptoms. Otherwise full review of systems performed by me is negative.   MEDICATIONS AT HOME:   Prior to Admission medications   Medication Sig Start Date End Date Taking? Authorizing Provider  Alpha-Lipoic Acid 200 MG CAPS Take 200 mg  by mouth 3 (three) times daily.    Yes Historical Provider, MD  B Complex-C-Folic Acid (MULTIVITAMIN, STRESS FORMULA) tablet Take 1 tablet by mouth daily. Reported on 03/12/2015   Yes Historical Provider, MD  calcium carbonate (OS-CAL) 600 MG TABS Take 600 mg by mouth daily.     Yes Historical Provider, MD  carbidopa-levodopa (SINEMET IR) 25-100 MG tablet Take 2 tablets by mouth 3 (three) times daily.  12/28/14 12/28/15 Yes Historical Provider, MD    ALPRAZolam Prudy Feeler) 0.25 MG tablet Take 1 tablet (0.25 mg total) by mouth 2 (two) times daily as needed for anxiety. 04/13/14   Shelia Media, MD  chlorpheniramine-HYDROcodone (TUSSIONEX PENNKINETIC ER) 10-8 MG/5ML SUER Take 5 mLs by mouth every 12 (twelve) hours as needed. 01/15/15   Shelia Media, MD  cycloSPORINE (RESTASIS) 0.05 % ophthalmic emulsion Place 1 drop into both eyes 2 (two) times daily.    Historical Provider, MD  DHA-EPA-Flaxseed Oil-Vitamin E (THERA TEARS NUTRITION PO) Take by mouth 2 (two) times daily.    Historical Provider, MD  fexofenadine (ALLEGRA) 180 MG tablet Take 1 tablet (180 mg total) by mouth daily. 06/24/12 12/25/14  Shelia Media, MD  furosemide (LASIX) 20 MG tablet Take 1 tablet (20 mg total) by mouth daily. 08/02/14   Shelia Media, MD  levothyroxine (SYNTHROID, LEVOTHROID) 125 MCG tablet Take 1 tablet (125 mcg total) by mouth daily. 03/12/15   Shelia Media, MD  losartan-hydrochlorothiazide [facility]) 50-12.5 MG tablet TAKE ONE TABLET BY MOUTH ONCE DAILY 06/04/15   Shelia Media, MD  Multiple Vitamin (MULTIVITAMIN) tablet Take 1 tablet by mouth daily. Reported on 03/12/2015    Historical Provider, MD  Omega-3 Fatty Acids (FISH OIL TRIPLE STRENGTH) 1400 MG CAPS Take by mouth daily.      Historical Provider, MD  pramipexole (MIRAPEX) 0.25 MG tablet Take 0.125 mg by mouth daily. 02/16/14 02/16/15  Historical Provider, MD  traZODone (DESYREL) 100 MG tablet Take by mouth. 12/28/14 12/23/15  Historical Provider, MD      VITAL SIGNS:  Blood pressure 162/71, pulse 50, resp. rate 20, height 5\' 6"  (1.676 m), weight 87.816 kg (193 lb 9.6 oz), SpO2 98 %.  PHYSICAL EXAMINATION:  VITAL SIGNS: Filed Vitals:   06/25/15 1229 06/25/15 1230  BP: 158/58 162/71  Pulse: 43 50  Resp: 20 20   GENERAL:70 y.o.female currently in no acute distress.  HEAD: Normocephalic, atraumatic.  EYES: Pupils equal, round, reactive to light. Extraocular muscles intact. No scleral  icterus.  MOUTH: Moist mucosal membrane. Dentition intact. No abscess noted.  EAR, NOSE, THROAT: Clear without exudates. No external lesions.  NECK: Supple. No thyromegaly. No nodules. No JVD.  PULMONARY: Clear to ascultation, without wheeze rails or rhonci. No use of accessory muscles, Good respiratory effort. good air entry bilaterally CHEST: Nontender to palpation.  CARDIOVASCULAR: S1 and S2. Regular rate and rhythm. No murmurs, rubs, or gallops. No edema. Pedal pulses 2+ bilaterally.  GASTROINTESTINAL: Soft, nontender, nondistended. No masses. Positive bowel sounds. No hepatosplenomegaly.  MUSCULOSKELETAL: No swelling, clubbing, or edema. Range of motion full in all extremities.  NEUROLOGIC: Cranial nerves II through XII are intact. No gross focal neurological deficits. Sensation intact. Reflexes intact.  SKIN: No ulceration, lesions, rashes, or cyanosis. Skin warm and dry. Turgor intact.  PSYCHIATRIC: Mood, affect within normal limits. The patient is awake, alert and oriented x 3. Insight, judgment intact.    LABORATORY PANEL:   CBC  Recent Labs Lab 06/25/15 1219  WBC 6.9  HGB  13.9  HCT 41.3  PLT 180   ------------------------------------------------------------------------------------------------------------------  Chemistries   Recent Labs Lab 06/25/15 1219  NA 137  K 4.1  CL 101  CO2 28  GLUCOSE 135*  BUN 29*  CREATININE 1.05*  CALCIUM 9.4   ------------------------------------------------------------------------------------------------------------------  Cardiac Enzymes  Recent Labs Lab 06/25/15 1219  TROPONINI <0.03   ------------------------------------------------------------------------------------------------------------------  RADIOLOGY:  Dg Chest 1 View  06/25/2015  CLINICAL DATA:  Syncopal episode.  Loss of consciousness. EXAM: CHEST 1 VIEW COMPARISON:  None. FINDINGS: Cardiac silhouette is upper limits of normal. The trachea is midline. The  lungs are clear without pulmonary edema or focal airspace disease. Negative for a pneumothorax. Bone structures are unremarkable. IMPRESSION: No acute cardiopulmonary disease. Electronically Signed   By: Richarda Overlie M.D.   On: 06/25/2015 13:24   Ct Head Wo Contrast  06/25/2015  CLINICAL DATA:  Syncope with loss of consciousness while gardening with husband. EXAM: CT HEAD WITHOUT CONTRAST CT CERVICAL SPINE WITHOUT CONTRAST TECHNIQUE: Multidetector CT imaging of the head and cervical spine was performed following the standard protocol without intravenous contrast. Multiplanar CT image reconstructions of the cervical spine were also generated. COMPARISON:  03/20/2007 FINDINGS: CT HEAD FINDINGS Mild low attenuation within the subcortical and periventricular white matter is identified. There is no evidence for acute cortical infarct, intracranial hemorrhage or mass. CT CERVICAL SPINE FINDINGS Straightening of normal cervical lordosis is likely related to patient positioning. The vertebral body heights are well preserved. The facet joints are well-aligned. Disc space narrowing and ventral endplate spurring is identified C4-5 and C5-6. Carotid artery calcifications are identified. No fractures or subluxations identified. IMPRESSION: 1. No acute intracranial abnormalities. 2. No evidence for cervical spine fracture or subluxation. Electronically Signed   By: Signa Kell M.D.   On: 06/25/2015 14:04   Ct Cervical Spine Wo Contrast  06/25/2015  CLINICAL DATA:  Syncope with loss of consciousness while gardening with husband. EXAM: CT HEAD WITHOUT CONTRAST CT CERVICAL SPINE WITHOUT CONTRAST TECHNIQUE: Multidetector CT imaging of the head and cervical spine was performed following the standard protocol without intravenous contrast. Multiplanar CT image reconstructions of the cervical spine were also generated. COMPARISON:  03/20/2007 FINDINGS: CT HEAD FINDINGS Mild low attenuation within the subcortical and periventricular  white matter is identified. There is no evidence for acute cortical infarct, intracranial hemorrhage or mass. CT CERVICAL SPINE FINDINGS Straightening of normal cervical lordosis is likely related to patient positioning. The vertebral body heights are well preserved. The facet joints are well-aligned. Disc space narrowing and ventral endplate spurring is identified C4-5 and C5-6. Carotid artery calcifications are identified. No fractures or subluxations identified. IMPRESSION: 1. No acute intracranial abnormalities. 2. No evidence for cervical spine fracture or subluxation. Electronically Signed   By: Signa Kell M.D.   On: 06/25/2015 14:04    EKG:   Orders placed or performed during the hospital encounter of 06/25/15  . ED EKG  . ED EKG  . ED EKG  . ED EKG  . EKG 12-Lead  . EKG 12-Lead    IMPRESSION AND PLAN:   70 year old Caucasian female history of hypothyroidism unspecified presenting after syncopal episode with bradycardia.  1. Syncope: Cardiac enzymes, telemetry, gentle IV fluid hydration 2. Symptomatic bradycardia: Transient in nature appears to be first-degree AV block on her original EKG regardless now back in normal sinus rhythm hold any further AV nodal agents based on telemetry and check echocardiogram consult cardiology she may require prolonged monitoring as an outpatient 3. Hypothyroidism unspecified: Continue  Synthroid check TSH 4. Essential hypertension: Add as needed hydralazine will hold her diuretic and hydrochlorothiazide given elevated BUN and syncopal episode 5. Venous thromboembolism prophylactic: Lovenox    All the records are reviewed and case discussed with ED provider. Management plans discussed with the patient, family and they are in agreement.  CODE STATUS: Full  TOTAL TIME TAKING CARE OF THIS PATIENT: 35 minutes.    Hower,  Mardi Mainland.D on 06/25/2015 at 3:05 PM  Between 7am to 6pm - Pager - [phone]  After 6pm: House Pager: -  [phone]  [facility]  Office  [phone]  CC: Primary care physician; Wynona Dove, MD

## 2015-06-25 NOTE — ED Provider Notes (Signed)
[facility] Emergency Department Provider Note   ____________________________________________  Time seen: Seen upon arrival to the emergency department  I have reviewed the triage vital signs and the nursing notes.   HISTORY  Chief Complaint Loss of Consciousness and Fall   HPI Hazel Samsancy Ruth Wisdom is a 70 y.o. female with a history of Parkinson's who is presenting to the emergency department today after syncopal episode. She says that she was gardening this morning and then blacked out. She denies any preceding symptoms. Denies any chest pain or shortness of breath at this time but does have sharp pain to the posterior of her neck as well as to the top of her head. She is denying any weakness or numbness. Went to urgent care after the event and then was sent here to the emergency department via ambulance after being found bradycardic. Per EMS, she eased herself slowly to the ground and did not suffer any high impact trauma. She denies being on any blood thinners.Patient denies taking any beta blockers or calcium channel blockers.   Past Medical History  Diagnosis Date  . Vertigo   . Sinus infection   . Thyroid disease     hypothyroidism  . Diverticula of intestine     Patient Active Problem List   Diagnosis Date Noted  . Syncope 06/25/2015  . Bradycardia 06/25/2015  . Urinary, incontinence, stress female 03/12/2015  . Edema 08/02/2014  . Bilateral leg numbness 05/15/2014  . Left sciatic nerve pain 04/13/2014  . Diverticulitis of colon 01/16/2014  . Medicare annual wellness visit, subsequent 07/08/2013  . Insomnia 01/31/2013  . PD (Parkinson's disease) (HCC) 01/05/2013  . Screening for breast cancer 12/31/2012  . Anemia 12/31/2012  . Hypothyroidism 07/02/2011  . Hypertension 07/02/2011    Past Surgical History  Procedure Laterality Date  . Tubal ligation  1988  . Thyroidectomy  1976  . Gallbladder surgery  2005  . Vaginal delivery       Current Outpatient Rx  Name  Route  Sig  Dispense  Refill  . Alpha-Lipoic Acid 200 MG CAPS   Oral   Take 600 mg by mouth daily.         Marland Kitchen. ALPRAZolam (XANAX) 0.25 MG tablet   Oral   Take 1 tablet (0.25 mg total) by mouth 2 (two) times daily as needed for anxiety.   20 tablet   0   . B Complex-C-Folic Acid (MULTIVITAMIN, STRESS FORMULA) tablet   Oral   Take 1 tablet by mouth daily. Reported on 03/12/2015         . calcium carbonate (OS-CAL) 600 MG TABS   Oral   Take 600 mg by mouth daily.           . carbidopa-levodopa (SINEMET IR) 25-100 MG tablet   Oral   Take by mouth.         . chlorpheniramine-HYDROcodone (TUSSIONEX PENNKINETIC ER) 10-8 MG/5ML SUER   Oral   Take 5 mLs by mouth every 12 (twelve) hours as needed.   140 mL   0   . cycloSPORINE (RESTASIS) 0.05 % ophthalmic emulsion   Both Eyes   Place 1 drop into both eyes 2 (two) times daily.         . DHA-EPA-Flaxseed Oil-Vitamin E (THERA TEARS NUTRITION PO)   Oral   Take by mouth 2 (two) times daily.         Marland Kitchen. EXPIRED: fexofenadine (ALLEGRA) 180 MG tablet   Oral   Take  1 tablet (180 mg total) by mouth daily.   90 tablet   4   . furosemide (LASIX) 20 MG tablet   Oral   Take 1 tablet (20 mg total) by mouth daily.   30 tablet   3   . levothyroxine (SYNTHROID, LEVOTHROID) 125 MCG tablet   Oral   Take 1 tablet (125 mcg total) by mouth daily.   90 tablet   3   . losartan-hydrochlorothiazide (HYZAAR) 50-12.5 MG tablet      TAKE ONE TABLET BY MOUTH ONCE DAILY   90 tablet   3   . Multiple Vitamin (MULTIVITAMIN) tablet   Oral   Take 1 tablet by mouth daily. Reported on 03/12/2015         . Omega-3 Fatty Acids (FISH OIL TRIPLE STRENGTH) 1400 MG CAPS   Oral   Take by mouth daily.           Marland Kitchen EXPIRED: pramipexole (MIRAPEX) 0.25 MG tablet   Oral   Take 0.125 mg by mouth daily.         . traZODone (DESYREL) 100 MG tablet   Oral   Take by mouth.            Allergies Lisinopril-hydrochlorothiazide and Penicillins  Family History  Problem Relation Age of Onset  . Lung cancer Mother   . Diabetes Mother   . Hypertension Mother   . Cancer Mother     lung  . Lung cancer Father   . Cancer Father     lung  . Diabetes Maternal Grandmother     Social History Social History  Substance Use Topics  . Smoking status: Never Smoker   . Smokeless tobacco: Never Used  . Alcohol Use: 0.0 oz/week    0 Standard drinks or equivalent per week     Comment: occasional    Review of Systems Constitutional: No fever/chills Eyes: No visual changes. ENT: No sore throat. Cardiovascular: Denies chest pain. Respiratory: Denies shortness of breath. Gastrointestinal: No abdominal pain.  No nausea, no vomiting.  No diarrhea.  No constipation. Genitourinary: Negative for dysuria. Musculoskeletal: Negative for back pain. Skin: Negative for rash. Neurological: Negative for focal weakness or numbness.  10-point ROS otherwise negative.  ____________________________________________   PHYSICAL EXAM:  VITAL SIGNS: ED Triage Vitals  Enc Vitals Group     BP 06/25/15 1229 158/58 mmHg     Pulse Rate 06/25/15 1229 43     Resp 06/25/15 1229 20     Temp --      Temp src --      SpO2 06/25/15 1229 95 %     Weight 06/25/15 1229 193 lb 9.6 oz (87.816 kg)     Height 06/25/15 1229 5\' 6"  (1.676 m)     Head Cir --      Peak Flow --      Pain Score 06/25/15 1230 0     Pain Loc --      Pain Edu? --      Excl. in GC? --     Constitutional: Alert and oriented. Well appearing and in no acute distress. Eyes: Conjunctivae are normal.  Right pupil slightly larger than the left by about 1-2 mm but both are reactive to light. Extraocular muscles are intact. Head: Atraumatic. Nose: No congestion/rhinnorhea. Mouth/Throat: Mucous membranes are moist.  Neck: No stridor.  Patient brought in with cervical collar. Tenderness to the midline of C1-C4 without any formula  or step-off. Cardiovascular: Bradycardic, regular rhythm. Grossly  normal heart sounds.  Good peripheral circulation. Respiratory: Normal respiratory effort.  No retractions. Lungs CTAB. Gastrointestinal: Soft and nontender. No distention. No CVA tenderness. Musculoskeletal: No lower extremity tenderness nor edema.  No joint effusions. Neurologic:  Normal speech and language. No gross focal neurologic deficits are appreciated.  Skin:  Skin is warm, dry and intact. No rash noted. Psychiatric: Mood and affect are normal. Speech and behavior are normal.  ____________________________________________   LABS (all labs ordered are listed, but only abnormal results are displayed)  Labs Reviewed  BASIC METABOLIC PANEL - Abnormal; Notable for the following:    Glucose, Bld 135 (*)    BUN 29 (*)    Creatinine, Ser 1.05 (*)    GFR calc non Af Amer 53 (*)    All other components within normal limits  CBC  TROPONIN I  CBC  CREATININE, SERUM  TSH   ____________________________________________  EKG  ED ECG REPORT I, Arelia Longest, the attending physician, personally viewed and interpreted this ECG.   Date: 06/25/2015  EKG Time: 1228  Rate: 42  Rhythm: sinus bradycardia with premature ventricular complex 1.  Axis: Normal axis  Intervals:first-degree A-V block  and left bundle branch block  ST&T Change: No ST segment elevation or depression. T-wave inversions in 1 as well as aVL. Last EKG was from 2014 and had a narrow QRS complex.  ____________________________________________  RADIOLOGY  CT Cervical Spine Wo Contrast (Final result) Result time: 06/25/15 14:04:00   Final result by Rad Results In Interface (06/25/15 14:04:00)   Narrative:   CLINICAL DATA: Syncope with loss of consciousness while gardening with husband.  EXAM: CT HEAD WITHOUT CONTRAST  CT CERVICAL SPINE WITHOUT CONTRAST  TECHNIQUE: Multidetector CT imaging of the head and cervical spine was performed  following the standard protocol without intravenous contrast. Multiplanar CT image reconstructions of the cervical spine were also generated.  COMPARISON: 03/20/2007  FINDINGS: CT HEAD FINDINGS  Mild low attenuation within the subcortical and periventricular white matter is identified. There is no evidence for acute cortical infarct, intracranial hemorrhage or mass.  CT CERVICAL SPINE FINDINGS  Straightening of normal cervical lordosis is likely related to patient positioning. The vertebral body heights are well preserved. The facet joints are well-aligned. Disc space narrowing and ventral endplate spurring is identified C4-5 and C5-6. Carotid artery calcifications are identified. No fractures or subluxations identified.  IMPRESSION: 1. No acute intracranial abnormalities. 2. No evidence for cervical spine fracture or subluxation.   Electronically Signed By: Signa Kell M.D. On: 06/25/2015 14:04          CT Head Wo Contrast (Final result) Result time: 06/25/15 14:04:00   Final result by Rad Results In Interface (06/25/15 14:04:00)   Narrative:   CLINICAL DATA: Syncope with loss of consciousness while gardening with husband.  EXAM: CT HEAD WITHOUT CONTRAST  CT CERVICAL SPINE WITHOUT CONTRAST  TECHNIQUE: Multidetector CT imaging of the head and cervical spine was performed following the standard protocol without intravenous contrast. Multiplanar CT image reconstructions of the cervical spine were also generated.  COMPARISON: 03/20/2007  FINDINGS: CT HEAD FINDINGS  Mild low attenuation within the subcortical and periventricular white matter is identified. There is no evidence for acute cortical infarct, intracranial hemorrhage or mass.  CT CERVICAL SPINE FINDINGS  Straightening of normal cervical lordosis is likely related to patient positioning. The vertebral body heights are well preserved. The facet joints are well-aligned. Disc space  narrowing and ventral endplate spurring is identified C4-5 and C5-6. Carotid  artery calcifications are identified. No fractures or subluxations identified.  IMPRESSION: 1. No acute intracranial abnormalities. 2. No evidence for cervical spine fracture or subluxation.   Electronically Signed By: Signa Kell M.D. On: 06/25/2015 14:04          DG Chest 1 View (Final result) Result time: 06/25/15 13:24:17   Final result by Rad Results In Interface (06/25/15 13:24:17)   Narrative:   CLINICAL DATA: Syncopal episode. Loss of consciousness.  EXAM: CHEST 1 VIEW  COMPARISON: None.  FINDINGS: Cardiac silhouette is upper limits of normal. The trachea is midline. The lungs are clear without pulmonary edema or focal airspace disease. Negative for a pneumothorax. Bone structures are unremarkable.  IMPRESSION: No acute cardiopulmonary disease.   Electronically Signed By: Richarda Overlie M.D. On: 06/25/2015 13:24     ____________________________________________   PROCEDURES    ____________________________________________   INITIAL IMPRESSION / ASSESSMENT AND PLAN / ED COURSE  Pertinent labs & imaging results that were available during my care of the patient were reviewed by me and considered in my medical decision making (see chart for details).  ----------------------------------------- 2:54 PM on 06/25/2015 -----------------------------------------  Able to clinically clear the cervical collar at this time. We palpated the C-spine and patient does not have any tenderness, deformity or step-off. Able to fully range the neck without any outward signs of pain or restriction. Explained to the patient that due to her intermittent bradycardia down to the 40s, syncope as well as new left bundle-branch block that she would need to be admitted to the hospital. She is requesting Dr. Mariah Milling for cardiology consult. She is understanding of the plan and willing to  comply. Signed out to Dr. Nemiah Commander. ____________________________________________   FINAL CLINICAL IMPRESSION(S) / ED DIAGNOSES  Syncope.    NEW MEDICATIONS STARTED DURING THIS VISIT:  New Prescriptions   No medications on file     Note:  This document was prepared using Dragon voice recognition software and may include unintentional dictation errors.    Myrna Blazer, MD 06/25/15 [phone]

## 2015-06-25 NOTE — Consult Note (Signed)
Cardiology Consult    Patient ID: Christina [name] MRN: [id number], DOB/AGE: 04-06-1945   Admit date: 06/25/2015 Date of Consult: 06/25/2015  Primary Physician: Wynona DoveWALKER,JENNIFER AZBELL, MD Primary Cardiologist: new - T. Mariah MillingGollan, MD  Requesting Provider: D. Hower, MD  Patient Profile    70 y/o ? with a h/o HTN and Parkinson's Dzs who was tx to [facility] ED today 2/2 syncope and bradycardia.  Past Medical History   Past Medical History  Diagnosis Date  . Vertigo   . Sinus infection   . Hypothyroidism     hypothyroidism  . Diverticula of intestine   . Parkinson's disease (HCC)   . Essential hypertension     Past Surgical History  Procedure Laterality Date  . Tubal ligation  1988  . Thyroidectomy  1976  . Gallbladder surgery  2005  . Vaginal delivery       Allergies  Allergies  Allergen Reactions  . Lisinopril-Hydrochlorothiazide Other (See Comments)    Post nasal drainage  . Penicillins Rash    History of Present Illness    70 y/o ? with a h/o HTN, parkinson's, and hypothyroidism.  She has no prior cardiac history and is very active @ home, living in [place].  She typically exercises @ the gym 2-3x/wk for 60 mins @ a time w/o limitations but over the past month has been very busy picking strawberries and doing assorted other yard related duties around her home.  She has no h/o chest pain, dyspnea, or palpitations.  She does have a h/o vertigo.  She was in her [place] until this past Saturday, 5/6, when she was @ a wedding and began to experience intermittent lightheadedness assoc with mild nausea.  She never felt like she might lose consciousness and thought that perhaps her vertigo was exacerbated by the strobe lights of the dance floor during the reception.  She continued to have intermittent lightheadedness for the remainder of the weekend.  This AM, she was transplanting a rose bush with her husband outside in their yard.  She had just carried over a rose  bush and placed it down and then stood back up and began walking back to her husband, who was watching her.  He says that she started to shake her head back and forth and then suddenly fell backwards onto the ground.  By her husband's estimate, she was unconscious for approximately 5-10 seconds.  When she regained consciousness, she was confused and disoriented.  She has no recollection of any events after placing the rose bush down.  They stayed at home for about an hour, thinking that her symptoms were secondary to vertigo but then her husband checked her BP and found that her pulse was in the 40's.  At that point, he [place] she was found to be bradycardic and EMS was called.  [facility] ED where ECG showed sinus brady with 1st deg AVB and LBBB.  Both the AVB and LBBB were new since 06/2012. Labs, CXR, and CT head were wnl.  Trop nl.  She was given a fluid bolus (BP's trending 150's to 170's) and admitted for further eval.  Currently she is asymptomatic.  She remains intermittently bradycardic on tele with evidence of WAP and pauses up to 2.14 seconds.  She denies c/p or dyspnea.  Inpatient Medications    . aspirin EC  81 mg Oral Daily  . calcium carbonate  500 mg Oral Daily  . carbidopa-levodopa  2 tablet Oral TID  . enoxaparin (LOVENOX) injection  40 mg Subcutaneous Q24H  . [START ON 06/26/2015] levothyroxine  125 mcg Oral QAC breakfast  . loratadine  10 mg Oral Daily  . multivitamin with minerals  1 tablet Oral Daily  . omega-3 acid ethyl esters  1 capsule Oral Daily  . pramipexole  0.125 mg Oral BID  . pramipexole  0.25 mg Oral QHS  . sodium chloride flush  3 mL Intravenous Q12H  . traZODone  100 mg Oral QHS    Family History    Family History  Problem Relation Age of Onset  . Lung cancer Mother   . Diabetes Mother   . Hypertension Mother   . Cancer Mother     lung  . Lung cancer Father   . Cancer Father     lung  . Diabetes Maternal  Grandmother   . Bradycardia Father     Social History    Social History   Social History  . Marital Status: Married    Spouse Name: N/A  . Number of Children: 2  . Years of Education: N/A   Occupational History  . 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     Social History Main Topics  . Smoking status: Never Smoker   . Smokeless tobacco: Never Used  . Alcohol Use: 0.0 oz/week    0 Standard drinks or equivalent per week     Comment: occasional/rare glass of wine.  . Drug Use: No  . Sexual Activity: Not Currently   Other Topics Concern  . Not on file   Social History Narrative   Regular Exercise -  YES, twice a week   Daily Caffeine Use:  2 cups coffee   Lives with husband in [place]. No pets. Work [employer] in [employer].      Review of Systems    General:  No chills, fever, night sweats or weight changes.  Cardiovascular:  +++ presyncope and syncope.  No chest pain, dyspnea on exertion, edema, orthopnea, palpitations, paroxysmal nocturnal dyspnea. Dermatological: No rash, lesions/masses Respiratory: No cough, dyspnea Urologic: No hematuria, dysuria Abdominal:   No nausea, vomiting, diarrhea, bright red blood per rectum, melena, or hematemesis Neurologic:  No visual changes, wkns, changes in mental status. All other systems reviewed and are otherwise negative except as noted above.  Physical Exam    Blood pressure 176/60, pulse 75, temperature 97.5 F (36.4 C), temperature source Oral, resp. rate 16, height 5\' 6"  (1.676 m), weight 193 lb 9.6 oz (87.816 kg), SpO2 98 %.  General: Pleasant, NAD Psych: Normal affect. Neuro: Alert and oriented X 3. Moves all extremities spontaneously. HEENT: Normal  Neck: Supple without bruits or JVD. Lungs:  Resp regular and unlabored, CTA. Heart: Irreg, brady, no s3, s4, or murmurs. Abdomen: Soft, non-tender, non-distended, BS + x 4.  Extremities: No clubbing, cyanosis or edema. DP/PT/Radials 2+ and equal bilaterally.  Labs      Recent Labs  06/25/15 1219  TROPONINI <0.03   Lab Results  Component Value Date   WBC 6.9 06/25/2015   HGB 13.9 06/25/2015   HCT 41.3 06/25/2015   MCV 88.9 06/25/2015   PLT 180 06/25/2015    Recent Labs Lab 06/25/15 1219  NA 137  K 4.1  CL 101  CO2 28  BUN 29*  CREATININE 1.05*  CALCIUM 9.4  GLUCOSE 135*   Lab Results  Component Value Date  CHOL 221* 03/12/2015   HDL 55.70 03/12/2015   LDLCALC 132* 03/12/2015   TRIG 165.0* 03/12/2015    Radiology Studies    Dg Chest 1 View  06/25/2015  CLINICAL DATA:  Syncopal episode.  Loss of consciousness. EXAM: CHEST 1 VIEW COMPARISON:  None. FINDINGS: Cardiac silhouette is upper limits of normal. The trachea is midline. The lungs are clear without pulmonary edema or focal airspace disease. Negative for a pneumothorax. Bone structures are unremarkable. IMPRESSION: No acute cardiopulmonary disease. Electronically Signed   By: Richarda Overlie M.D.   On: 06/25/2015 13:24   Ct Head Wo Contrast  06/25/2015  CLINICAL DATA:  Syncope with loss of consciousness while gardening with husband. EXAM: CT HEAD WITHOUT CONTRAST CT CERVICAL SPINE WITHOUT CONTRAST TECHNIQUE: Multidetector CT imaging of the head and cervical spine was performed following the standard protocol without intravenous contrast. Multiplanar CT image reconstructions of the cervical spine were also generated. COMPARISON:  03/20/2007 FINDINGS: CT HEAD FINDINGS Mild low attenuation within the subcortical and periventricular white matter is identified. There is no evidence for acute cortical infarct, intracranial hemorrhage or mass. CT CERVICAL SPINE FINDINGS Straightening of normal cervical lordosis is likely related to patient positioning. The vertebral body heights are well preserved. The facet joints are well-aligned. Disc space narrowing and ventral endplate spurring is identified C4-5 and C5-6. Carotid artery calcifications are identified. No fractures or subluxations identified.  IMPRESSION: 1. No acute intracranial abnormalities. 2. No evidence for cervical spine fracture or subluxation. Electronically Signed   By: Signa Kell M.D.   On: 06/25/2015 14:04   Ct Cervical Spine Wo Contrast  06/25/2015  CLINICAL DATA:  Syncope with loss of consciousness while gardening with husband. EXAM: CT HEAD WITHOUT CONTRAST CT CERVICAL SPINE WITHOUT CONTRAST TECHNIQUE: Multidetector CT imaging of the head and cervical spine was performed following the standard protocol without intravenous contrast. Multiplanar CT image reconstructions of the cervical spine were also generated. COMPARISON:  03/20/2007 FINDINGS: CT HEAD FINDINGS Mild low attenuation within the subcortical and periventricular white matter is identified. There is no evidence for acute cortical infarct, intracranial hemorrhage or mass. CT CERVICAL SPINE FINDINGS Straightening of normal cervical lordosis is likely related to patient positioning. The vertebral body heights are well preserved. The facet joints are well-aligned. Disc space narrowing and ventral endplate spurring is identified C4-5 and C5-6. Carotid artery calcifications are identified. No fractures or subluxations identified. IMPRESSION: 1. No acute intracranial abnormalities. 2. No evidence for cervical spine fracture or subluxation. Electronically Signed   By: Signa Kell M.D.   On: 06/25/2015 14:04    ECG & Cardiac Imaging    Sinus brady, 54, leftward axis, LBBB (new since 06/2012).  Tele: sinus brady w/ first deg avb and WAP.  PR intervals vary from 210 msec to 270 msec.  No clear wenckebach and no evidence of high grade heart block.  Occasional pauses, generally under 2 secs.  Pause of 2.14 second noted as well.  No prolonged pauses.  Assessment & Plan    1.  Syncope:  Pt admitted earlier today following a syncopal episode while transplanting a rose bush at home.  She has been having lightheadedness since 5/6, which she thought may be attributed to her h/o  vertigo.  In the ED, she was found to be bradycardic in the 40's - 50's, though asymptomatic @ rest. ECG/tele notable for 1st deg AVB, sinus arrhythmia, new LBBB, wandering atrial pacemaker, and intermittent sinus arrest with pauses up to 2.14 seconds.  She  is currently asymptomatic.  Lab work unrevealing for reversible causes of bradycardia/presyncope/syncope.  She is not on any AVN blocking agents @ home.  Cycle CE.  Check TSH (h/o hypothyroidism).  Echo pending.  Follow tele.  Check orthostatics.  Ambulate to assess chronotropic competence.  If EF down on echo, will need ischemic evaluation.  If no events on tele, will need 30 day event monitoring.  No driving.  2.  LBBB:  New since 06/2012.  No h/o chest pain or dyspnea.  Very active w/o limitations.  Check echo.  Consider ischemic eval.  3.  Essential HTN:  BP elevated since arrival.  On losartan-hctz @ home.  Check orthostatics.  4.  HL:  TC 221.  LDL 132.  10 yr risk of CV event 29.4.  Should be on statin - can defer to PCP.  Signed, Nicolasa Ducking, NP 06/25/2015, 5:00 PM

## 2015-06-25 NOTE — Progress Notes (Signed)
Pt ambulated around nursing station x2. Pt heart rate maintained in 70's-80's. Pt tolerated ambulation well. Pt states that she " fells fine". Will continue to monitor.   Mayra NeerNesbitt, Linea Calles M

## 2015-06-25 NOTE — ED Notes (Signed)
Pt presents to ED with c/o syncopal episode with LOC while gardening with husband. Pt went to [facility] urgent care; HR 48.Pt with c-collar in place; complains of neck pain.alert and oriented x4.

## 2015-06-25 NOTE — ED Notes (Signed)
Patient states she was in the yard and passed out

## 2015-06-25 NOTE — Progress Notes (Addendum)
Gollan made aware pt briefly bradyed down into 30s  No orders, will see patient, she is not symptomatic

## 2015-06-26 ENCOUNTER — Telehealth: Payer: Self-pay | Admitting: Internal Medicine

## 2015-06-26 ENCOUNTER — Observation Stay (HOSPITAL_BASED_OUTPATIENT_CLINIC_OR_DEPARTMENT_OTHER): Payer: Medicare HMO

## 2015-06-26 ENCOUNTER — Observation Stay: Payer: Medicare HMO

## 2015-06-26 ENCOUNTER — Telehealth: Payer: Self-pay | Admitting: *Deleted

## 2015-06-26 ENCOUNTER — Telehealth: Payer: Self-pay

## 2015-06-26 DIAGNOSIS — R55 Syncope and collapse: Secondary | ICD-10-CM

## 2015-06-26 DIAGNOSIS — R001 Bradycardia, unspecified: Secondary | ICD-10-CM | POA: Diagnosis not present

## 2015-06-26 LAB — BASIC METABOLIC PANEL
ANION GAP: 6 (ref 5–15)
BUN: 20 mg/dL (ref 6–20)
CALCIUM: 8.8 mg/dL — AB (ref 8.9–10.3)
CO2: 27 mmol/L (ref 22–32)
CREATININE: 0.94 mg/dL (ref 0.44–1.00)
Chloride: 105 mmol/L (ref 101–111)
GLUCOSE: 98 mg/dL (ref 65–99)
Potassium: 3.8 mmol/L (ref 3.5–5.1)
SODIUM: 138 mmol/L (ref 135–145)

## 2015-06-26 LAB — CBC
HCT: 39.7 % (ref 35.0–47.0)
Hemoglobin: 13.3 g/dL (ref 12.0–16.0)
MCH: 29.4 pg (ref 26.0–34.0)
MCHC: 33.6 g/dL (ref 32.0–36.0)
MCV: 87.7 fL (ref 80.0–100.0)
PLATELETS: 184 10*3/uL (ref 150–440)
RBC: 4.53 MIL/uL (ref 3.80–5.20)
RDW: 14.1 % (ref 11.5–14.5)
WBC: 6 10*3/uL (ref 3.6–11.0)

## 2015-06-26 LAB — ECHOCARDIOGRAM COMPLETE
Height: 66 in
Weight: 3097.6 oz

## 2015-06-26 MED ORDER — ASPIRIN 81 MG PO TBEC: 81.0000 mg | DELAYED_RELEASE_TABLET | Freq: Every day | ORAL | Status: AC

## 2015-06-26 NOTE — Consult Note (Signed)
  ELECTROPHYSIOLOGY CONSULT NOTE  Patient ID: Christina [name], MRN: [id number], DOB/AGE: 10/22/1945 70 y.o. Admit date: 06/25/2015 Date of Consult: 06/26/2015  Primary Physician: WALKER,JENNIFER AZBELL, MD Primary Cardiologist: TG  Chief Complaint: syncope   HPI Christina [name] is a 70 y.o. female evaluated for syncope.  Her episode was characterized by the abrupt loss of consciousness. She fell backwards. Her eyes were open and she was arousable 5-10 seconds later and was able, with her husband's assistance, to ambulate back to the house. At that point she remained confused as to what day it was, what she had been doing. Blood pressure was recorded shortly thereafter and was in the 160 range; heart rates were noted to be low.  ECG was normal in 2014 prior to the initiation of her Parkinson medications which include pramipexole and carbidopa. These have been very effective in controlling her Parkinson symptoms.  She denies a history of exercise intolerance. She's had no prior syncope. She had noted positional lightheadedness a day or 2 before this syncopal episode; she also had a headache a couple of days.  Echocardiogram demonstrates normal LV function  Telemetry demonstrates sinus node dysfunction with bradycardia into the high 30s; she has first-degree AV block and left bundle branch block new from 2014  Past Medical History  Diagnosis Date  . Vertigo   . Sinus infection   . Hypothyroidism     hypothyroidism  . Diverticula of intestine   . Parkinson's disease (HCC)   . Essential hypertension       Surgical History:  Past Surgical History  Procedure Laterality Date  . Tubal ligation  1988  . Thyroidectomy  1976  . Gallbladder surgery  2005  . Vaginal delivery       Home Meds: Prior to Admission medications   Medication Sig Start Date End Date Taking? Authorizing Provider  Alpha-Lipoic Acid 200 MG CAPS Take 200 mg by mouth 3 (three) times daily.    Yes  Historical Provider, MD  B Complex-C-Folic Acid (MULTIVITAMIN, STRESS FORMULA) tablet Take 1 tablet by mouth daily. Reported on 03/12/2015   Yes Historical Provider, MD  calcium carbonate (OS-CAL) 600 MG TABS Take 600 mg by mouth daily.     Yes Historical Provider, MD  carbidopa-levodopa (SINEMET IR) 25-100 MG tablet Take 2 tablets by mouth 3 (three) times daily.  12/28/14 12/28/15 Yes Historical Provider, MD  fexofenadine (ALLEGRA) 180 MG tablet Take 1 tablet (180 mg total) by mouth daily. 06/24/12 06/25/15 Yes Jennifer A Walker, MD  hydroxypropyl methylcellulose / hypromellose (ISOPTO TEARS / GONIOVISC) 2.5 % ophthalmic solution Place 1 drop into both eyes 2 (two) times daily as needed for dry eyes.   Yes Historical Provider, MD  levothyroxine (SYNTHROID, LEVOTHROID) 125 MCG tablet Take 1 tablet (125 mcg total) by mouth daily. 03/12/15  Yes Jennifer A Walker, MD  Lifitegrast (XIIDRA) 5 % SOLN Place 1 drop into both eyes 2 (two) times daily.   Yes Historical Provider, MD  losartan-hydrochlorothiazide (HYZAAR) 50-12.5 MG tablet TAKE ONE TABLET BY MOUTH ONCE DAILY 06/04/15  Yes Jennifer A Walker, MD  Multiple Vitamin (MULTIVITAMIN) tablet Take 1 tablet by mouth daily. Reported on 03/12/2015   Yes Historical Provider, MD  Omega 3 1200 MG CAPS Take 1 capsule by mouth daily.   Yes Historical Provider, MD  pramipexole (MIRAPEX) 0.25 MG tablet Take 0.125-0.25 mg by mouth daily. Pt. Takes 0.5 tablet in the morning at at 1400, then takes a whole tablet at bedtime. 02/16/14 06/25/15   Yes Historical Provider, MD  traZODone (DESYREL) 100 MG tablet Take 100 mg by mouth at bedtime.  12/28/14 12/23/15 Yes Historical Provider, MD    Inpatient Medications:  . aspirin EC  81 mg Oral Daily  . calcium carbonate  500 mg Oral Daily  . carbidopa-levodopa  2 tablet Oral TID  . enoxaparin (LOVENOX) injection  40 mg Subcutaneous Q24H  . levothyroxine  125 mcg Oral QAC breakfast  . loratadine  10 mg Oral Daily  . multivitamin with  minerals  1 tablet Oral Daily  . omega-3 acid ethyl esters  1 capsule Oral Daily  . pramipexole  0.125 mg Oral BID  . pramipexole  0.25 mg Oral QHS  . sodium chloride flush  3 mL Intravenous Q12H  . traZODone  100 mg Oral QHS      Allergies:  Allergies  Allergen Reactions  . Lisinopril-Hydrochlorothiazide Other (See Comments)    Post nasal drainage  . Penicillins Rash    Social History   Social History  . Marital Status: Married    Spouse Name: N/A  . Number of Children: 2  . Years of Education: N/A   Occupational History  . 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     Social History Main Topics  . Smoking status: Never Smoker   . Smokeless tobacco: Never Used  . Alcohol Use: 0.0 oz/week    0 Standard drinks or equivalent per week     Comment: occasional/rare glass of wine.  . Drug Use: No  . Sexual Activity: Not Currently   Other Topics Concern  . Not on file   Social History Narrative   Regular Exercise -  YES, twice a week   Daily Caffeine Use:  2 cups coffee   Lives with husband in [place]. No pets. Work [employer] in [place].      Family History  Problem Relation Age of Onset  . Lung cancer Mother   . Diabetes Mother   . Hypertension Mother   . Cancer Mother     lung  . Lung cancer Father   . Cancer Father     lung  . Diabetes Maternal Grandmother   . Bradycardia Father      ROS:  Please see the history of present illness.     All other systems reviewed and negative.    Physical Exam:   Blood pressure 102/39, pulse 43, temperature 98 F (36.7 C), temperature source Oral, resp. rate 18, height 5' 6" (1.676 m), weight 193 lb 9.6 oz (87.816 kg), SpO2 95 %. General: Well developed, well nourished female in no acute distress. Head: Normocephalic, atraumatic, sclera non-icteric, no xanthomas, nares are without discharge. EENT: normal Lymph Nodes:  none Back: without scoliosis/kyphosis , no CVA tendersness Neck: Negative for carotid bruits. JVD not  elevated. Lungs: Clear bilaterally to auscultation without wheezes, rales, or rhonchi. Breathing is unlabored. Heart: RRR with soft S1 S2. No  murmur , rubs, or gallops appreciated. Abdomen: Soft, non-tender, non-distended with normoactive bowel sounds. No hepatomegaly. No rebound/guarding. No obvious abdominal masses. Msk:  Strength and tone appear normal for age. Extremities: No clubbing or cyanosis. No  edema.  Distal pedal pulses are 2+ and equal bilaterally. Skin: Warm and Dry Neuro: Alert and oriented X 3. CN III-XII intact Grossly normal sensory and motor function . Psych:  Responds to questions appropriately with a normal affect.      Labs: Cardiac Enzymes  Recent Labs  06/25/15 1219 06/25/15   1606 06/25/15 2131  TROPONINI <0.03 <0.03 <0.03   CBC Lab Results  Component Value Date   WBC 6.0 06/26/2015   HGB 13.3 06/26/2015   HCT 39.7 06/26/2015   MCV 87.7 06/26/2015   PLT 184 06/26/2015   PROTIME: No results for input(s): LABPROT, INR in the last 72 hours. Chemistry  Recent Labs Lab 06/26/15 0500  NA 138  K 3.8  CL 105  CO2 27  BUN 20  CREATININE 0.94  CALCIUM 8.8*  GLUCOSE 98   Lipids Lab Results  Component Value Date   CHOL 221* 03/12/2015   HDL 55.70 03/12/2015   LDLCALC 132* 03/12/2015   TRIG 165.0* 03/12/2015   BNP No results found for: PROBNP Thyroid Function Tests:  Recent Labs  06/25/15 1636  TSH 1.827      Miscellaneous No results found for: DDIMER  Radiology/Studies:  Dg Chest 1 View  06/25/2015  CLINICAL DATA:  Syncopal episode.  Loss of consciousness. EXAM: CHEST 1 VIEW COMPARISON:  None. FINDINGS: Cardiac silhouette is upper limits of normal. The trachea is midline. The lungs are clear without pulmonary edema or focal airspace disease. Negative for a pneumothorax. Bone structures are unremarkable. IMPRESSION: No acute cardiopulmonary disease. Electronically Signed   By: Adam  Henn M.D.   On: 06/25/2015 13:24   Ct Head Wo  Contrast  06/25/2015  CLINICAL DATA:  Syncope with loss of consciousness while gardening with husband. EXAM: CT HEAD WITHOUT CONTRAST CT CERVICAL SPINE WITHOUT CONTRAST TECHNIQUE: Multidetector CT imaging of the head and cervical spine was performed following the standard protocol without intravenous contrast. Multiplanar CT image reconstructions of the cervical spine were also generated. COMPARISON:  03/20/2007 FINDINGS: CT HEAD FINDINGS Mild low attenuation within the subcortical and periventricular white matter is identified. There is no evidence for acute cortical infarct, intracranial hemorrhage or mass. CT CERVICAL SPINE FINDINGS Straightening of normal cervical lordosis is likely related to patient positioning. The vertebral body heights are well preserved. The facet joints are well-aligned. Disc space narrowing and ventral endplate spurring is identified C4-5 and C5-6. Carotid artery calcifications are identified. No fractures or subluxations identified. IMPRESSION: 1. No acute intracranial abnormalities. 2. No evidence for cervical spine fracture or subluxation. Electronically Signed   By: Taylor  Stroud M.D.   On: 06/25/2015 14:04   Ct Cervical Spine Wo Contrast  06/25/2015  CLINICAL DATA:  Syncope with loss of consciousness while gardening with husband. EXAM: CT HEAD WITHOUT CONTRAST CT CERVICAL SPINE WITHOUT CONTRAST TECHNIQUE: Multidetector CT imaging of the head and cervical spine was performed following the standard protocol without intravenous contrast. Multiplanar CT image reconstructions of the cervical spine were also generated. COMPARISON:  03/20/2007 FINDINGS: CT HEAD FINDINGS Mild low attenuation within the subcortical and periventricular white matter is identified. There is no evidence for acute cortical infarct, intracranial hemorrhage or mass. CT CERVICAL SPINE FINDINGS Straightening of normal cervical lordosis is likely related to patient positioning. The vertebral body heights are well  preserved. The facet joints are well-aligned. Disc space narrowing and ventral endplate spurring is identified C4-5 and C5-6. Carotid artery calcifications are identified. No fractures or subluxations identified. IMPRESSION: 1. No acute intracranial abnormalities. 2. No evidence for cervical spine fracture or subluxation. Electronically Signed   By: Taylor  Stroud M.D.   On: 06/25/2015 14:04    EKG: sinus with r5ates 35-70 different p wave Intervals 24/15/47 Left bundle branch block     Assessment and Plan:   Syncope  Left bundle branch block  Sinus   node dysfunction    first degree AV block  Parkinson's disease   The abrupt onset of her syncope would suggest an arrhythmic manifestation. The prolonged recovery phase and its accompanying confusion would speak against a primary arrhythmic event and raises the specter of a neurological event, particularly the confusion. However, the response to syncope in the context of Parkinson's disease and accompanied by her neurological medications is not something for which I have any experience and I wonder whether they might effect the recovery phase of a cardiac/arrhythmic syncopal event  It is noteworthy that his antiparkinson medications are frequently associated with orthostatic intolerance and neurally mediated syncope.;  Obviously, in the context of the new ECG abnormalities noted since 2014 it is hard to be dismissive of a cardiac arrhythmic cause however.  Literature search demonstrates infrequent but reported episodes of heart block and sinus bradycardia related to pramipexole, these reversed with the discontinuation of the drug. We would need to defer to neurology whether there are alternative treatments for her Parkinson's. The patient is enthusiastic about staying on these drugs and would probably prefer pacing as opposed to discontinuing the medications.  Both heart block and sinus node dysfunction have been associated with pramipexole  and carbidopa.; this, in the context of her normal echo, makes it unlikely that there is underlying structural heart disease that needs to be further explored.  Hence, following neurological evaluation and recommendations regarding the sustaining of her anti Parkinson medications further cardiac recommendations will follow.       Demetreus Lothamer   

## 2015-06-26 NOTE — Progress Notes (Signed)
RN spoke with Dr. Graciela HusbandsKlein, cardiology who believes bradycardia and BBB can be attributed to sinemet and mirapex and that patient needs a pacemaker. Dr. Graciela HusbandsKlein states patient can discharge today, does not holter monitor, and his office will call patient tomorrow to schedule pacemaker.

## 2015-06-26 NOTE — Telephone Encounter (Signed)
-----   Message from Coralee RudSabrina F Gilley sent at 06/26/2015 10:21 AM EDT ----- Regarding: tcm/ph 07/26/2015 Dr. Graciela HusbandsKlein 10:30 May need pacer

## 2015-06-26 NOTE — Discharge Summary (Signed)
[facility] [place] at [facility]   PATIENT NAME: Christina [name]    MR#:  [id number]  DATE OF BIRTH:  27-Feb-1945  DATE OF ADMISSION:  06/25/2015 ADMITTING PHYSICIAN: Wyatt Haste, MD  DATE OF DISCHARGE: 06/26/2015  PRIMARY CARE PHYSICIAN: Wynona Dove, MD    ADMISSION DIAGNOSIS:  Syncope and collapse [R55]   DISCHARGE DIAGNOSIS:    SECONDARY DIAGNOSIS:   Past Medical History  Diagnosis Date  . Vertigo   . Sinus infection   . Hypothyroidism     hypothyroidism  . Diverticula of intestine   . Parkinson's disease (HCC)   . Essential hypertension     HOSPITAL COURSE:   70 year old Caucasian female history of hypothyroidism unspecified presenting after syncopal episode with bradycardia.  1. Syncope with bradycardia,  possible due to arrhythmia or Parkinson's disease medications per E cardiologist Dr. Graciela Husbands.  EKG showed Left bundle branch block, Sinus node dysfunction andfirst degree AV block. normal Cardiac enzymes. Echo and MRI brain are normal. EEG is pending. Per Dr. Jodi Mourning, patient can follow up as outpatient. Need Holter monitor 30 days per Dr. Graciela Husbands and Dr. Mariah Milling.  2. Hypothyroidism unspecified: Continue Synthroid, normal TSH. 3. Essential hypertension: Added as needed hydralazine,  hold her diuretic and hydrochlorothiazide given elevated BUN and syncopal episode.  4. Dehydration improved with IVF. 5. Venous thromboembolism prophylactic: Lovenox  I discussed with Dr. Mariah Milling, Dr. Graciela Husbands and Dr. Jodi Mourning.  DISCHARGE CONDITIONS:   Stable, discharge to home today.  CONSULTS OBTAINED:  Treatment Team:  Wyatt Haste, MD Antonieta Iba, MD Kym Groom, MD Thana Farr, MD  DRUG ALLERGIES:   Allergies  Allergen Reactions  . Lisinopril-Hydrochlorothiazide Other (See Comments)    Post nasal drainage  . Penicillins Rash    DISCHARGE MEDICATIONS:   Current Discharge Medication List    START taking these  medications   Details  aspirin EC 81 MG EC tablet Take 1 tablet (81 mg total) by mouth daily. Qty: 30 tablet, Refills: 2      CONTINUE these medications which have NOT CHANGED   Details  Alpha-Lipoic Acid 200 MG CAPS Take 200 mg by mouth 3 (three) times daily.     B Complex-C-Folic Acid (MULTIVITAMIN, STRESS FORMULA) tablet Take 1 tablet by mouth daily. Reported on 03/12/2015    calcium carbonate (OS-CAL) 600 MG TABS Take 600 mg by mouth daily.      carbidopa-levodopa (SINEMET IR) 25-100 MG tablet Take 2 tablets by mouth 3 (three) times daily.     fexofenadine (ALLEGRA) 180 MG tablet Take 1 tablet (180 mg total) by mouth daily. Qty: 90 tablet, Refills: 4   Associated Diagnoses: Allergic rhinitis    hydroxypropyl methylcellulose / hypromellose (ISOPTO TEARS / GONIOVISC) 2.5 % ophthalmic solution Place 1 drop into both eyes 2 (two) times daily as needed for dry eyes.    levothyroxine (SYNTHROID, LEVOTHROID) 125 MCG tablet Take 1 tablet (125 mcg total) by mouth daily. Qty: 90 tablet, Refills: 3    Lifitegrast (XIIDRA) 5 % SOLN Place 1 drop into both eyes 2 (two) times daily.    losartan-hydrochlorothiazide (HYZAAR) 50-12.5 MG tablet TAKE ONE TABLET BY MOUTH ONCE DAILY Qty: 90 tablet, Refills: 3    Multiple Vitamin (MULTIVITAMIN) tablet Take 1 tablet by mouth daily. Reported on 03/12/2015    Omega 3 1200 MG CAPS Take 1 capsule by mouth daily.    pramipexole (MIRAPEX) 0.25 MG tablet Take 0.125-0.25 mg by mouth daily. Pt. Takes 0.5 tablet in the  morning at at 1400, then takes a whole tablet at bedtime.    traZODone (DESYREL) 100 MG tablet Take 100 mg by mouth at bedtime.          DISCHARGE INSTRUCTIONS:   If you experience worsening of your admission symptoms, develop shortness of breath, life threatening emergency, suicidal or homicidal thoughts you must seek medical attention immediately by calling 911 or calling your MD immediately  if symptoms less severe.  You Must read  complete instructions/literature along with all the possible adverse reactions/side effects for all the Medicines you take and that have been prescribed to you. Take any new Medicines after you have completely understood and accept all the possible adverse reactions/side effects.   Please note  You were cared for by a hospitalist during your hospital stay. If you have any questions about your discharge medications or the care you received while you were in the hospital after you are discharged, you can call the unit and asked to speak with the hospitalist on call if the hospitalist that took care of you is not available. Once you are discharged, your primary care physician will handle any further medical issues. Please note that NO REFILLS for any discharge medications will be authorized once you are discharged, as it is imperative that you return to your primary care physician (or establish a relationship with a primary care physician if you do not have one) for your aftercare needs so that they can reassess your need for medications and monitor your lab values.    Today   SUBJECTIVE   No complaint.   VITAL SIGNS:  Blood pressure 153/52, pulse 44, temperature 98.2 F (36.8 C), temperature source Oral, resp. rate 18, height  (1.676 m), weight 87.816 kg (193 lb 9.6 oz), SpO2 97 %.  I/O:   Intake/Output Summary (Last 24 hours) at 06/26/15 1539 Last data filed at 06/26/15 1346  Gross per 24 hour  Intake 1587.5 ml  Output    725 ml  Net  862.5 ml    PHYSICAL EXAMINATION:  GENERAL:  70 y.o.-year-old patient lying in the bed with no acute distress.  EYES: Pupils equal, round, reactive to light and accommodation. No scleral icterus. Extraocular muscles intact.  HEENT: Head atraumatic, normocephalic. Oropharynx and nasopharynx clear.  NECK:  Supple, no jugular venous distention. No thyroid enlargement, no tenderness.  LUNGS: Normal breath sounds bilaterally, no wheezing, rales,rhonchi  or crepitation. No use of accessory muscles of respiration.  CARDIOVASCULAR: S1, S2 normal. No murmurs, rubs, or gallops.  ABDOMEN: Soft, non-tender, non-distended. Bowel sounds present. No organomegaly or mass.  EXTREMITIES: No pedal edema, cyanosis, or clubbing.  NEUROLOGIC: Cranial nerves II through XII are intact. Muscle strength 5/5 in all extremities. Sensation intact. Gait not checked.  PSYCHIATRIC: The patient is alert and oriented x 3.  SKIN: No obvious rash, lesion, or ulcer.   DATA REVIEW:   CBC  Recent Labs Lab 06/26/15 0500  WBC 6.0  HGB 13.3  HCT 39.7  PLT 184    Chemistries   Recent Labs Lab 06/26/15 0500  NA 138  K 3.8  CL 105  CO2 27  GLUCOSE 98  BUN 20  CREATININE 0.94  CALCIUM 8.8*    Cardiac Enzymes  Recent Labs Lab 06/25/15 2131  TROPONINI <0.03    Microbiology Results  Results for orders placed or performed in visit on 04/13/14  CULTURE, URINE COMPREHENSIVE     Status: None   Collection Time: 04/13/14 11:16 AM  Result Value Ref Range Status   Culture ESCHERICHIA COLI  Final   Colony Count 45,000 COLONIES/ML  Final   Organism ID, Bacteria ESCHERICHIA COLI  Final      Susceptibility   Escherichia coli -  (no method available)    AMPICILLIN 4 Sensitive     AMOX/CLAVULANIC <=2 Sensitive     AMPICILLIN/SULBACTAM <=2 Sensitive     PIP/TAZO <=4 Sensitive     IMIPENEM <=0.25 Sensitive     CEFAZOLIN <=4 Sensitive     CEFTRIAXONE <=1 Sensitive     CEFTAZIDIME <=1 Sensitive     CEFEPIME <=1 Sensitive     GENTAMICIN <=1 Sensitive     TOBRAMYCIN <=1 Sensitive     CIPROFLOXACIN >=4 Resistant     LEVOFLOXACIN >=8 Resistant     NITROFURANTOIN <=16 Sensitive     TRIMETH/SULFA >=320 Resistant     RADIOLOGY:  Dg Chest 1 View  06/25/2015  CLINICAL DATA:  Syncopal episode.  Loss of consciousness. EXAM: CHEST 1 VIEW COMPARISON:  None. FINDINGS: Cardiac silhouette is upper limits of normal. The trachea is midline. The lungs are clear without  pulmonary edema or focal airspace disease. Negative for a pneumothorax. Bone structures are unremarkable. IMPRESSION: No acute cardiopulmonary disease. Electronically Signed   By: Richarda Overlie M.D.   On: 06/25/2015 13:24   Ct Head Wo Contrast  06/25/2015  CLINICAL DATA:  Syncope with loss of consciousness while gardening with husband. EXAM: CT HEAD WITHOUT CONTRAST CT CERVICAL SPINE WITHOUT CONTRAST TECHNIQUE: Multidetector CT imaging of the head and cervical spine was performed following the standard protocol without intravenous contrast. Multiplanar CT image reconstructions of the cervical spine were also generated. COMPARISON:  03/20/2007 FINDINGS: CT HEAD FINDINGS Mild low attenuation within the subcortical and periventricular white matter is identified. There is no evidence for acute cortical infarct, intracranial hemorrhage or mass. CT CERVICAL SPINE FINDINGS Straightening of normal cervical lordosis is likely related to patient positioning. The vertebral body heights are well preserved. The facet joints are well-aligned. Disc space narrowing and ventral endplate spurring is identified C4-5 and C5-6. Carotid artery calcifications are identified. No fractures or subluxations identified. IMPRESSION: 1. No acute intracranial abnormalities. 2. No evidence for cervical spine fracture or subluxation. Electronically Signed   By: Signa Kell M.D.   On: 06/25/2015 14:04   Ct Cervical Spine Wo Contrast  06/25/2015  CLINICAL DATA:  Syncope with loss of consciousness while gardening with husband. EXAM: CT HEAD WITHOUT CONTRAST CT CERVICAL SPINE WITHOUT CONTRAST TECHNIQUE: Multidetector CT imaging of the head and cervical spine was performed following the standard protocol without intravenous contrast. Multiplanar CT image reconstructions of the cervical spine were also generated. COMPARISON:  03/20/2007 FINDINGS: CT HEAD FINDINGS Mild low attenuation within the subcortical and periventricular white matter is  identified. There is no evidence for acute cortical infarct, intracranial hemorrhage or mass. CT CERVICAL SPINE FINDINGS Straightening of normal cervical lordosis is likely related to patient positioning. The vertebral body heights are well preserved. The facet joints are well-aligned. Disc space narrowing and ventral endplate spurring is identified C4-5 and C5-6. Carotid artery calcifications are identified. No fractures or subluxations identified. IMPRESSION: 1. No acute intracranial abnormalities. 2. No evidence for cervical spine fracture or subluxation. Electronically Signed   By: Signa Kell M.D.   On: 06/25/2015 14:04   Mr Brain Wo Contrast  06/26/2015  CLINICAL DATA:  71 year old female with syncopal episode, intermittent vertigo. Bradycardia on arrival. Initial encounter. EXAM: MRI HEAD WITHOUT CONTRAST TECHNIQUE:  Multiplanar, multiecho pulse sequences of the brain and surrounding structures were obtained without intravenous contrast. COMPARISON:  Head and cervical spine CT 06/25/2015. Brain MRI 03/20/2007. FINDINGS: Major intracranial vascular flow voids are within normal limits, dominant appearing distal right vertebral artery. Cerebral volume is within normal limits for age. No restricted diffusion to suggest acute infarction. No midline shift, mass effect, evidence of mass lesion, ventriculomegaly, extra-axial collection or acute intracranial hemorrhage. Cervicomedullary junction and pituitary are within normal limits. Mild for age scattered nonspecific cerebral white matter T2 and FLAIR hyperintense foci, with mild progression since 2009. No cortical encephalomalacia or chronic cerebral blood products. Deep gray matter nuclei, brainstem, and cerebellum are normal for age. Visible internal auditory structures appear normal. Mastoids are clear. Trace paranasal sinus mucosal thickening. Negative orbit and scalp soft tissues. Negative visualized cervical spine. Visualized bone marrow signal is within  normal limits. IMPRESSION: 1.  No acute intracranial abnormality. 2. Mild for age nonspecific cerebral white matter signal changes with mild progression since 2009. Most commonly this reflects chronic small vessel disease. Electronically Signed   By: Odessa FlemingH  Hall M.D.   On: 06/26/2015 11:43        Management plans discussed with the patient, her husband and they are in agreement.  CODE STATUS:     Code Status Orders        Start     Ordered   06/25/15 1445  Full code   Continuous     06/25/15 1444    Code Status History    Date Active Date Inactive Code Status Order ID Comments User Context   This patient has a current code status but no historical code status.    Advance Directive Documentation        Most Recent Value   Type of Advance Directive  Healthcare Power of Attorney   Pre-existing out of facility DNR order (yellow form or pink MOST form)     "MOST" Form in Place?        TOTAL TIME TAKING CARE OF THIS PATIENT: 48 minutes.    Shaune Pollackhen, Valbona Slabach M.D on 06/26/2015 at 3:39 PM  Between 7am to 6pm - Pager - [phone]  After 6pm go to [email or web address] - password [email or web address] [facility]  [facility]  Office  [phone]  CC: Primary care physician; Wynona DoveWALKER,JENNIFER AZBELL, MD

## 2015-06-26 NOTE — Telephone Encounter (Signed)
Unable to reach patient.  Attempt to follow up with transitional care management.  Left a message on voicemail to return my call at the office.  Appointment scheduled with PCP 06/29/15 at 8:30am.

## 2015-06-26 NOTE — Progress Notes (Signed)
Per pt, [facility]form [facility] rather than scheduling outpatient. EEG reports they will be here around 1500.

## 2015-06-26 NOTE — Telephone Encounter (Signed)
Appointment to be scheduled 06/29/15 at 8:30.  Will continue to follow as appropriate.

## 2015-06-26 NOTE — Progress Notes (Signed)
Pt. Discharged to home via wc. Discharge instructions and medication regimen reviewed at bedside with patient. Pt. verbalizes understanding of instructions and medication regimen. Prescriptions included with d/c papers. Told patient to expect call from dr. Odessa FlemingKlein's office tomorrow morning to schedule pacemaker, pt aware she does not need holter monitor at discharge. Patient assessment unchanged from this morning. TELE and IV discontinued per policy.

## 2015-06-26 NOTE — Telephone Encounter (Signed)
HFU-dx. syncope, d/c 06/26/15. Please advise were to add pt to schedule/msn

## 2015-06-26 NOTE — Consult Note (Signed)
Reason for Consult:Syncope Referring Physician: Imogene Burnhen  CC: Syncope  HPI: Christina [name] is an 70 y.o. female who reports that she was in the yard on yesterday and had a syncopal episode.  Patient had no warning.  Was unconscious for a short period of time but was confused, unable to recall where she was and what was going on for about 45 minutes.  Patient remembers nothing after passing out until at Urgent Care.   Patient reports that she had not been feeling well for several days prior to the event.  She was unsteady on her feet and had some sinus pressure.  At the time she felt it was from some strobe lights at a wedding.   Patient on PD medications and unwilling to make any medication changes due to her superior quality of life on her current regimen.    Past Medical History  Diagnosis Date  . Vertigo   . Sinus infection   . Hypothyroidism     hypothyroidism  . Diverticula of intestine   . Parkinson's disease (HCC)   . Essential hypertension     Past Surgical History  Procedure Laterality Date  . Tubal ligation  1988  . Thyroidectomy  1976  . Gallbladder surgery  2005  . Vaginal delivery      Family History  Problem Relation Age of Onset  . Lung cancer Mother   . Diabetes Mother   . Hypertension Mother   . Cancer Mother     lung  . Lung cancer Father   . Cancer Father     lung  . Diabetes Maternal Grandmother   . Bradycardia Father     Social History:  reports that she has never smoked. She has never used smokeless tobacco. She reports that she drinks alcohol. She reports that she does not use illicit drugs.  Allergies  Allergen Reactions  . Lisinopril-Hydrochlorothiazide Other (See Comments)    Post nasal drainage  . Penicillins Rash    Medications:  I have reviewed the patient's current medications. Prior to Admission:  Prescriptions prior to admission  Medication Sig Dispense Refill Last Dose  . Alpha-Lipoic Acid 200 MG CAPS Take 200 mg by mouth 3  (three) times daily.    06/25/2015 at Unknown time  . B Complex-C-Folic Acid (MULTIVITAMIN, STRESS FORMULA) tablet Take 1 tablet by mouth daily. Reported on 03/12/2015   06/25/2015 at Unknown time  . calcium carbonate (OS-CAL) 600 MG TABS Take 600 mg by mouth daily.     06/25/2015 at Unknown time  . carbidopa-levodopa (SINEMET IR) 25-100 MG tablet Take 2 tablets by mouth 3 (three) times daily.    06/25/2015 at Unknown time  . fexofenadine (ALLEGRA) 180 MG tablet Take 1 tablet (180 mg total) by mouth daily. 90 tablet 4 06/24/2015 at Unknown time  . hydroxypropyl methylcellulose / hypromellose (ISOPTO TEARS / GONIOVISC) 2.5 % ophthalmic solution Place 1 drop into both eyes 2 (two) times daily as needed for dry eyes.   prn  . levothyroxine (SYNTHROID, LEVOTHROID) 125 MCG tablet Take 1 tablet (125 mcg total) by mouth daily. 90 tablet 3 06/25/2015 at 0700  . Lifitegrast (XIIDRA) 5 % SOLN Place 1 drop into both eyes 2 (two) times daily.   06/25/2015 at Unknown time  . losartan-hydrochlorothiazide (HYZAAR) 50-12.5 MG tablet TAKE ONE TABLET BY MOUTH ONCE DAILY 90 tablet 3 06/25/2015 at 0700  . Multiple Vitamin (MULTIVITAMIN) tablet Take 1 tablet by mouth daily. Reported on 03/12/2015   06/25/2015 at Unknown  time  . Omega 3 1200 MG CAPS Take 1 capsule by mouth daily.   06/25/2015 at Unknown time  . pramipexole (MIRAPEX) 0.25 MG tablet Take 0.125-0.25 mg by mouth daily. Pt. Takes 0.5 tablet in the morning at at 1400, then takes a whole tablet at bedtime.   06/25/2015 at 0700  . traZODone (DESYREL) 100 MG tablet Take 100 mg by mouth at bedtime.    06/24/2015 at Unknown time   Scheduled: . aspirin EC  81 mg Oral Daily  . calcium carbonate  500 mg Oral Daily  . carbidopa-levodopa  2 tablet Oral TID  . enoxaparin (LOVENOX) injection  40 mg Subcutaneous Q24H  . levothyroxine  125 mcg Oral QAC breakfast  . loratadine  10 mg Oral Daily  . multivitamin with minerals  1 tablet Oral Daily  . omega-3 acid ethyl esters  1 capsule Oral Daily   . pramipexole  0.125 mg Oral BID  . pramipexole  0.25 mg Oral QHS  . sodium chloride flush  3 mL Intravenous Q12H  . traZODone  100 mg Oral QHS    ROS: History obtained from the patient  General ROS: negative for - chills, fatigue, fever, night sweats, weight gain or weight loss Psychological ROS: negative for - behavioral disorder, hallucinations, memory difficulties, mood swings or suicidal ideation Ophthalmic ROS: negative for - blurry vision, double vision, eye pain or loss of vision ENT ROS: as noted in HPI Allergy and Immunology ROS: negative for - hives or itchy/watery eyes Hematological and Lymphatic ROS: negative for - bleeding problems, bruising or swollen lymph nodes Endocrine ROS: negative for - galactorrhea, hair pattern changes, polydipsia/polyuria or temperature intolerance Respiratory ROS: negative for - cough, hemoptysis, shortness of breath or wheezing Cardiovascular ROS: negative for - chest pain, dyspnea on exertion, edema or irregular heartbeat Gastrointestinal ROS: negative for - abdominal pain, diarrhea, hematemesis, nausea/vomiting or stool incontinence Genito-Urinary ROS: negative for - dysuria, hematuria, incontinence or urinary frequency/urgency Musculoskeletal ROS: negative for - joint swelling or muscular weakness Neurological ROS: as noted in HPI Dermatological ROS: negative for rash and skin lesion changes  Physical Examination: Blood pressure 102/39, pulse 43, temperature 98 F (36.7 C), temperature source Oral, resp. rate 18, height 5\' 6"  (1.676 m), weight 87.816 kg (193 lb 9.6 oz), SpO2 95 %.  HEENT-  Normocephalic, no lesions, without obvious abnormality.  Normal external eye and conjunctiva.  Normal TM's bilaterally.  Normal auditory canals and external ears. Normal external nose, mucus membranes and septum.  Normal pharynx. Cardiovascular- S1, S2 normal, pulses palpable throughout   Lungs- chest clear, no wheezing, rales, normal symmetric air  entry Abdomen- soft, non-tender; bowel sounds normal; no masses,  no organomegaly Extremities- no edema Lymph-no adenopathy palpable Musculoskeletal-no joint tenderness, deformity or swelling Skin-warm and dry, no hyperpigmentation, vitiligo, or suspicious lesions  Neurological Examination Mental Status: Alert, oriented, thought content appropriate.  Speech fluent without evidence of aphasia.  Able to follow 3 step commands without difficulty. Cranial Nerves: II: Discs flat bilaterally; Visual fields grossly normal, pupils equal, round, reactive to light and accommodation III,IV, VI: ptosis not present, extra-ocular motions intact bilaterally V,VII: smile symmetric, facial light touch sensation normal bilaterally VIII: hearing normal bilaterally IX,X: gag reflex present XI: bilateral shoulder shrug XII: midline tongue extension Motor: Right : Upper extremity   5/5    Left:     Upper extremity   5/5  Lower extremity   5/5     Lower extremity   5/5 Tone and bulk:normal  tone throughout; no atrophy noted Sensory: Pinprick and light touch intact throughout, bilaterally Deep Tendon Reflexes: 2+ and symmetric throughout Plantars: Right: downgoing   Left: downgoing Cerebellar: normal finger-to-nose, normal rapid alternating movements and normal heel-to-shin test Gait: normal gait and station    Laboratory Studies:   Basic Metabolic Panel:  Recent Labs Lab 06/25/15 1219 06/26/15 0500  NA 137 138  K 4.1 3.8  CL 101 105  CO2 28 27  GLUCOSE 135* 98  BUN 29* 20  CREATININE 1.05* 0.94  CALCIUM 9.4 8.8*    Liver Function Tests: No results for input(s): AST, ALT, ALKPHOS, BILITOT, PROT, ALBUMIN in the last 168 hours. No results for input(s): LIPASE, AMYLASE in the last 168 hours. No results for input(s): AMMONIA in the last 168 hours.  CBC:  Recent Labs Lab 06/25/15 1219 06/26/15 0500  WBC 6.9 6.0  HGB 13.9 13.3  HCT 41.3 39.7  MCV 88.9 87.7  PLT 180 184    Cardiac  Enzymes:  Recent Labs Lab 06/25/15 1219 06/25/15 1606 06/25/15 2131  TROPONINI <0.03 <0.03 <0.03    BNP: Invalid input(s): POCBNP  CBG: No results for input(s): GLUCAP in the last 168 hours.  Microbiology: Results for orders placed or performed in visit on 04/13/14  CULTURE, URINE COMPREHENSIVE     Status: None   Collection Time: 04/13/14 11:16 AM  Result Value Ref Range Status   Culture ESCHERICHIA COLI  Final   Colony Count 45,000 COLONIES/ML  Final   Organism ID, Bacteria ESCHERICHIA COLI  Final      Susceptibility   Escherichia coli -  (no method available)    AMPICILLIN 4 Sensitive     AMOX/CLAVULANIC <=2 Sensitive     AMPICILLIN/SULBACTAM <=2 Sensitive     PIP/TAZO <=4 Sensitive     IMIPENEM <=0.25 Sensitive     CEFAZOLIN <=4 Sensitive     CEFTRIAXONE <=1 Sensitive     CEFTAZIDIME <=1 Sensitive     CEFEPIME <=1 Sensitive     GENTAMICIN <=1 Sensitive     TOBRAMYCIN <=1 Sensitive     CIPROFLOXACIN >=4 Resistant     LEVOFLOXACIN >=8 Resistant     NITROFURANTOIN <=16 Sensitive     TRIMETH/SULFA >=320 Resistant     Coagulation Studies: No results for input(s): LABPROT, INR in the last 72 hours.  Urinalysis: No results for input(s): COLORURINE, LABSPEC, PHURINE, GLUCOSEU, HGBUR, BILIRUBINUR, KETONESUR, PROTEINUR, UROBILINOGEN, NITRITE, LEUKOCYTESUR in the last 168 hours.  Invalid input(s): APPERANCEUR  Lipid Panel:     Component Value Date/Time   CHOL 221* 03/12/2015 1041   TRIG 165.0* 03/12/2015 1041   HDL 55.70 03/12/2015 1041   CHOLHDL 4 03/12/2015 1041   VLDL 33.0 03/12/2015 1041   LDLCALC 132* 03/12/2015 1041    HgbA1C:  Lab Results  Component Value Date   HGBA1C 6.0 05/15/2014    Urine Drug Screen:  No results found for: LABOPIA, COCAINSCRNUR, LABBENZ, AMPHETMU, THCU, LABBARB  Alcohol Level: No results for input(s): ETH in the last 168 hours.  Other results: EKG: sinus bradycardia at 42 bpm.  Imaging: Dg Chest 1 View  06/25/2015   CLINICAL DATA:  Syncopal episode.  Loss of consciousness. EXAM: CHEST 1 VIEW COMPARISON:  None. FINDINGS: Cardiac silhouette is upper limits of normal. The trachea is midline. The lungs are clear without pulmonary edema or focal airspace disease. Negative for a pneumothorax. Bone structures are unremarkable. IMPRESSION: No acute cardiopulmonary disease. Electronically Signed   By: Meriel Pica.D.  On: 06/25/2015 13:24   Ct Head Wo Contrast  06/25/2015  CLINICAL DATA:  Syncope with loss of consciousness while gardening with husband. EXAM: CT HEAD WITHOUT CONTRAST CT CERVICAL SPINE WITHOUT CONTRAST TECHNIQUE: Multidetector CT imaging of the head and cervical spine was performed following the standard protocol without intravenous contrast. Multiplanar CT image reconstructions of the cervical spine were also generated. COMPARISON:  03/20/2007 FINDINGS: CT HEAD FINDINGS Mild low attenuation within the subcortical and periventricular white matter is identified. There is no evidence for acute cortical infarct, intracranial hemorrhage or mass. CT CERVICAL SPINE FINDINGS Straightening of normal cervical lordosis is likely related to patient positioning. The vertebral body heights are well preserved. The facet joints are well-aligned. Disc space narrowing and ventral endplate spurring is identified C4-5 and C5-6. Carotid artery calcifications are identified. No fractures or subluxations identified. IMPRESSION: 1. No acute intracranial abnormalities. 2. No evidence for cervical spine fracture or subluxation. Electronically Signed   By: Signa Kell M.D.   On: 06/25/2015 14:04   Ct Cervical Spine Wo Contrast  06/25/2015  CLINICAL DATA:  Syncope with loss of consciousness while gardening with husband. EXAM: CT HEAD WITHOUT CONTRAST CT CERVICAL SPINE WITHOUT CONTRAST TECHNIQUE: Multidetector CT imaging of the head and cervical spine was performed following the standard protocol without intravenous contrast. Multiplanar CT  image reconstructions of the cervical spine were also generated. COMPARISON:  03/20/2007 FINDINGS: CT HEAD FINDINGS Mild low attenuation within the subcortical and periventricular white matter is identified. There is no evidence for acute cortical infarct, intracranial hemorrhage or mass. CT CERVICAL SPINE FINDINGS Straightening of normal cervical lordosis is likely related to patient positioning. The vertebral body heights are well preserved. The facet joints are well-aligned. Disc space narrowing and ventral endplate spurring is identified C4-5 and C5-6. Carotid artery calcifications are identified. No fractures or subluxations identified. IMPRESSION: 1. No acute intracranial abnormalities. 2. No evidence for cervical spine fracture or subluxation. Electronically Signed   By: Signa Kell M.D.   On: 06/25/2015 14:04     Assessment/Plan: 70 year old female presenting after an episode of syncope followed by confusion for which the patient is amnestic.  Although the patient has been documented during this hosptilaization to have cardiac arrhythmias, I agree with cardiology in that it is unusual for a patient to have this prolonged period of confusion after a brief syncopal episode that is cardiac mediated.  Patient was not orthostatic and although there is a possible side effect of arrhythmia with both Sinemet and Trazodone, neither of these are new medications for the patient.  Would consider the possibility that there may have been an embolic event that occurred at the time of her arrhythmia.  Will rule out seizure as well.    Recommendations: 1.  MRI of the brain without contrast 2.  EEG.  This may be performed as an outpatient. 3.  Patient unable to drive.     Thana Farr, MD Neurology [phone] 06/26/2015, 10:44 AM

## 2015-06-26 NOTE — Telephone Encounter (Signed)
Patient [facility].  Patient understands to follow up with provider Dr. Graciela HusbandsKlein on 07/26/15 at 10:30AM at [facility]. Patient understands discharge instructions? Still in hospital Patient understands medications and regiment? Still in hospital Patient understands to bring all medications to this visit? yes  Patient is still in hospital at this time and spoke with her husband regarding follow up appointment. Instructed him to please call back if he has any questions regarding her discharge instructions, medications, or appointments once she goes home. He verbalized understanding and had no further questions.

## 2015-06-26 NOTE — Progress Notes (Signed)
Rounding MD unsure if patient needs to wait on EEG results before being discharged. MD to speak with neurology and notify this RN when he has answer.

## 2015-06-26 NOTE — Discharge Instructions (Signed)
Heart healthy diet. °Activity as tolerated. °Fall precaution. °

## 2015-06-27 ENCOUNTER — Telehealth: Payer: Self-pay | Admitting: Internal Medicine

## 2015-06-27 ENCOUNTER — Encounter: Payer: Self-pay | Admitting: *Deleted

## 2015-06-27 NOTE — Telephone Encounter (Signed)
Call placed to patient per Dr. Graciela HusbandsKlein to schedule PPM implant. The patient is agreeable with proceeding. I advised her Dr. Graciela HusbandsKlein can do this on Monday 07/02/15 at 10:00 am. The patient is agreeable. Verbal instructions given to patient for procedure. Letter also mailed to patient- mailing address confirmed. She will follow up in 10 -14 days for a [place]- [facility] Will cancel 07/26/15 appt w/ Dr. Graciela HusbandsKlein.

## 2015-06-28 ENCOUNTER — Telehealth: Payer: Self-pay

## 2015-06-28 NOTE — Telephone Encounter (Signed)
Unable to reach patient.  Second attempt to follow up with transitional care management.  Appointment scheduled with PCP tomorrow 06/29/15 at 0830.  Confirmed with scheduler.

## 2015-06-29 ENCOUNTER — Ambulatory Visit (INDEPENDENT_AMBULATORY_CARE_PROVIDER_SITE_OTHER): Payer: Medicare HMO | Admitting: Internal Medicine

## 2015-06-29 ENCOUNTER — Encounter: Payer: Self-pay | Admitting: Internal Medicine

## 2015-06-29 ENCOUNTER — Ambulatory Visit: Payer: Medicare HMO | Admitting: Internal Medicine

## 2015-06-29 VITALS — BP 144/68 | HR 55 | Temp 98.1°F | Ht 66.0 in | Wt 189.2 lb

## 2015-06-29 DIAGNOSIS — R001 Bradycardia, unspecified: Secondary | ICD-10-CM

## 2015-06-29 DIAGNOSIS — R55 Syncope and collapse: Secondary | ICD-10-CM

## 2015-06-29 NOTE — Patient Instructions (Signed)
Follow up after pacemaker placement.

## 2015-06-29 NOTE — Progress Notes (Addendum)
Subjective:    Patient ID: Christina [name], female    DOB: January 12, 1946, 70 y.o.   MRN: [id number]  HPI  70YO female presents for hospital follow up.  ADMITTED: 06/25/2015 DISCHARGED: 06/26/2015  DIAGNOSIS: Syncope  Was working in garden when had syncopal episodes. 30-69min LOC. Fell in dirt, but no known injury. Admitted to [facility]. Evaluated by neurology and cardiology. Noted to have bradycardia.  Scheduled for pacemaker implant Monday. Feeling relatively well. However feels lightheaded with bending over. Not driving. HR has been in 40s-50s. No changes made to medications.   CT head and cervical spine showed no acute process. CXR was normal. MRI brain showed no acute process. ECHO showed normal LV function.   Wt Readings from Last 3 Encounters:  06/29/15 189 lb 3.2 oz (85.821 kg)  06/25/15 193 lb 9.6 oz (87.816 kg)  03/12/15 190 lb 4 oz (86.297 kg)   BP Readings from Last 3 Encounters:  06/29/15 144/68  06/26/15 153/52  06/25/15 174/74    Past Medical History  Diagnosis Date  . Vertigo   . Sinus infection   . Hypothyroidism     hypothyroidism  . Diverticula of intestine   . Parkinson's disease (HCC)   . Essential hypertension    Family History  Problem Relation Age of Onset  . Lung cancer Mother   . Diabetes Mother   . Hypertension Mother   . Cancer Mother     lung  . Lung cancer Father   . Cancer Father     lung  . Diabetes Maternal Grandmother   . Bradycardia Father    Past Surgical History  Procedure Laterality Date  . Tubal ligation  1988  . Thyroidectomy  1976  . Gallbladder surgery  2005  . Vaginal delivery     Social History   Social History  . Marital Status: Married    Spouse Name: N/A  . Number of Children: 2  . Years of Education: N/A   Occupational History  . 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     Social History Main Topics  . Smoking status: Never Smoker   . Smokeless tobacco: Never Used  . Alcohol Use: 0.0  oz/week    0 Standard drinks or equivalent per week     Comment: occasional/rare glass of wine.  . Drug Use: No  . Sexual Activity: Not Currently   Other Topics Concern  . None   Social History Narrative   Regular Exercise -  YES, twice a week   Daily Caffeine Use:  2 cups coffee   Lives with husband in [place]. No pets. Work [employer] in [employer].    Medications were reviewed with patient.  Review of Systems  Constitutional: Negative for fever, chills, appetite change, fatigue and unexpected weight change.  Eyes: Negative for visual disturbance.  Respiratory: Negative for chest tightness and shortness of breath.   Cardiovascular: Negative for chest pain, palpitations and leg swelling.  Gastrointestinal: Negative for abdominal pain, diarrhea and constipation.  Skin: Negative for color change and rash.  Neurological: Positive for syncope and light-headedness. Negative for dizziness, seizures, facial asymmetry, weakness, numbness and headaches.  Hematological: Negative for adenopathy. Does not bruise/bleed easily.  Psychiatric/Behavioral: Negative for dysphoric mood. The patient is not nervous/anxious.        Objective:    BP 144/68 mmHg  Pulse 55  Temp(Src) 98.1 F (36.7 C) (Oral)  Ht  (1.676 m)  Wt 189 lb 3.2 oz (  85.821 kg)  BMI 30.55 kg/m2  SpO2 95% Physical Exam  Constitutional: She is oriented to person, place, and time. She appears well-developed and well-nourished. No distress.  HENT:  Head: Normocephalic and atraumatic.  Right Ear: External ear normal.  Left Ear: External ear normal.  Nose: Nose normal.  Mouth/Throat: Oropharynx is clear and moist.  Eyes: Conjunctivae are normal. Pupils are equal, round, and reactive to light. Right eye exhibits no discharge. Left eye exhibits no discharge. No scleral icterus.  Neck: Normal range of motion. Neck supple. No tracheal deviation present. No thyromegaly present.  Cardiovascular: Normal rate, regular rhythm, normal heart  sounds and intact distal pulses.  Exam reveals no gallop and no friction rub.   No murmur heard. Pulmonary/Chest: Effort normal and breath sounds normal. No respiratory distress. She has no wheezes. She has no rales. She exhibits no tenderness.  Musculoskeletal: Normal range of motion. She exhibits no edema or tenderness.  Lymphadenopathy:    She has no cervical adenopathy.  Neurological: She is alert and oriented to person, place, and time. No cranial nerve deficit. She exhibits normal muscle tone. Coordination normal.  Skin: Skin is warm and dry. No rash noted. She is not diaphoretic. No erythema. No pallor.  Psychiatric: She has a normal mood and affect. Her behavior is normal. Judgment and thought content normal.          Assessment & Plan:   Problem List Items Addressed This Visit      Unprioritized   Bradycardia    Scheduled for pacemaker placement Monday. Will follow.      Syncope and collapse - Primary    Reviewed notes from hospital stay, including consult notes from Neurology, Cardiology, and ECHO and imaging.  She is scheduled for pacemaker placement Monday. Will follow.          Return in about 4 weeks (around 07/27/2015) for Recheck.  Ronna PolioJennifer Kendell Gammon, MD Internal Medicine [facility]

## 2015-06-29 NOTE — Assessment & Plan Note (Signed)
Scheduled for pacemaker placement Monday. Will follow.

## 2015-06-29 NOTE — Telephone Encounter (Signed)
Error. Duplicate encounter.

## 2015-06-29 NOTE — Progress Notes (Signed)
Pre visit review using our clinic review tool, if applicable. No additional management support is needed unless otherwise documented below in the visit note. 

## 2015-06-29 NOTE — Assessment & Plan Note (Signed)
Reviewed notes from hospital stay, including consult notes from Neurology, Cardiology, and ECHO and imaging.  She is scheduled for pacemaker placement Monday. Will follow.

## 2015-06-30 ENCOUNTER — Other Ambulatory Visit: Payer: Self-pay | Admitting: Internal Medicine

## 2015-06-30 DIAGNOSIS — I447 Left bundle-branch block, unspecified: Secondary | ICD-10-CM

## 2015-06-30 DIAGNOSIS — I459 Conduction disorder, unspecified: Secondary | ICD-10-CM

## 2015-07-02 ENCOUNTER — Encounter (HOSPITAL_COMMUNITY): Payer: Self-pay | Admitting: *Deleted

## 2015-07-02 ENCOUNTER — Ambulatory Visit (HOSPITAL_COMMUNITY)
Admission: RE | Admit: 2015-07-02 | Discharge: 2015-07-03 | Disposition: A | Discharge status: Home / Self Care | Payer: Medicare HMO | Source: Ambulatory Visit | Attending: Internal Medicine | Admitting: Internal Medicine

## 2015-07-02 ENCOUNTER — Encounter (HOSPITAL_COMMUNITY)
Admission: RE | Disposition: A | Discharge status: Home / Self Care | Payer: Self-pay | Source: Ambulatory Visit | Attending: Internal Medicine

## 2015-07-02 DIAGNOSIS — Z880 Allergy status to penicillin: Secondary | ICD-10-CM | POA: Diagnosis not present

## 2015-07-02 DIAGNOSIS — Z8249 Family history of ischemic heart disease and other diseases of the circulatory system: Secondary | ICD-10-CM | POA: Diagnosis not present

## 2015-07-02 DIAGNOSIS — E039 Hypothyroidism, unspecified: Secondary | ICD-10-CM | POA: Insufficient documentation

## 2015-07-02 DIAGNOSIS — Z7982 Long term (current) use of aspirin: Secondary | ICD-10-CM | POA: Insufficient documentation

## 2015-07-02 DIAGNOSIS — I495 Sick sinus syndrome: Secondary | ICD-10-CM | POA: Insufficient documentation

## 2015-07-02 DIAGNOSIS — I447 Left bundle-branch block, unspecified: Secondary | ICD-10-CM | POA: Diagnosis present

## 2015-07-02 DIAGNOSIS — Z959 Presence of cardiac and vascular implant and graft, unspecified: Secondary | ICD-10-CM

## 2015-07-02 DIAGNOSIS — G20 Parkinson's disease: Secondary | ICD-10-CM | POA: Diagnosis not present

## 2015-07-02 DIAGNOSIS — I440 Atrioventricular block, first degree: Secondary | ICD-10-CM | POA: Diagnosis not present

## 2015-07-02 DIAGNOSIS — R55 Syncope and collapse: Secondary | ICD-10-CM | POA: Insufficient documentation

## 2015-07-02 DIAGNOSIS — I455 Other specified heart block: Secondary | ICD-10-CM | POA: Diagnosis not present

## 2015-07-02 DIAGNOSIS — I10 Essential (primary) hypertension: Secondary | ICD-10-CM | POA: Diagnosis not present

## 2015-07-02 DIAGNOSIS — I459 Conduction disorder, unspecified: Secondary | ICD-10-CM

## 2015-07-02 HISTORY — PX: EP IMPLANTABLE DEVICE: SHX172B

## 2015-07-02 LAB — SURGICAL PCR SCREEN
MRSA, PCR: NEGATIVE
Staphylococcus aureus: NEGATIVE

## 2015-07-02 SURGERY — PACEMAKER IMPLANT
Anesthesia: LOCAL

## 2015-07-02 MED ORDER — PRAMIPEXOLE DIHYDROCHLORIDE 0.25 MG PO TABS
0.2500 mg | ORAL_TABLET | Freq: Every day | ORAL | Status: DC
  Administered 2015-07-02: 0.25 mg via ORAL
  Filled 2015-07-02: qty 1

## 2015-07-02 MED ORDER — VANCOMYCIN HCL IN DEXTROSE 1-5 GM/200ML-% IV SOLN
INTRAVENOUS | Status: AC
  Filled 2015-07-02: qty 200

## 2015-07-02 MED ORDER — MIDAZOLAM HCL 5 MG/5ML IJ SOLN
INTRAMUSCULAR | Status: AC
  Filled 2015-07-02: qty 5

## 2015-07-02 MED ORDER — MUPIROCIN 2 % EX OINT
TOPICAL_OINTMENT | CUTANEOUS | Status: AC
  Administered 2015-07-02: 09:00:00 via NASAL
  Filled 2015-07-02: qty 22

## 2015-07-02 MED ORDER — ALPHA-LIPOIC ACID 200 MG PO CAPS: 200.0000 mg | ORAL_CAPSULE | Freq: Three times a day (TID) | ORAL | Status: DC

## 2015-07-02 MED ORDER — FOLIC ACID 1 MG PO TABS
1.0000 mg | ORAL_TABLET | Freq: Every day | ORAL | Status: DC
  Administered 2015-07-03: 1 mg via ORAL
  Filled 2015-07-02: qty 1

## 2015-07-02 MED ORDER — B COMPLEX-C PO TABS
1.0000 | ORAL_TABLET | Freq: Every day | ORAL | Status: DC
  Administered 2015-07-03: 1 via ORAL
  Filled 2015-07-02: qty 1

## 2015-07-02 MED ORDER — ONDANSETRON HCL 4 MG/2ML IJ SOLN: 4.0000 mg | Freq: Four times a day (QID) | INTRAMUSCULAR | Status: DC | PRN

## 2015-07-02 MED ORDER — FENTANYL CITRATE (PF) 100 MCG/2ML IJ SOLN
INTRAMUSCULAR | Status: DC | PRN
  Administered 2015-07-02: 50 ug via INTRAVENOUS
  Administered 2015-07-02: 12.5 ug via INTRAVENOUS

## 2015-07-02 MED ORDER — LORATADINE 10 MG PO TABS
10.0000 mg | ORAL_TABLET | Freq: Every day | ORAL | Status: DC
  Filled 2015-07-02: qty 1

## 2015-07-02 MED ORDER — PRAMIPEXOLE DIHYDROCHLORIDE 0.25 MG PO TABS
0.1250 mg | ORAL_TABLET | Freq: Two times a day (BID) | ORAL | Status: DC
Start: 2015-07-02 — End: 2015-07-03
  Administered 2015-07-02 – 2015-07-03 (×2): 0.125 mg via ORAL
  Filled 2015-07-02 (×2): qty 0.5

## 2015-07-02 MED ORDER — VANCOMYCIN HCL IN DEXTROSE 1-5 GM/200ML-% IV SOLN
1000.0000 mg | Freq: Two times a day (BID) | INTRAVENOUS | Status: AC
  Administered 2015-07-02: 1000 mg via INTRAVENOUS
  Filled 2015-07-02: qty 200

## 2015-07-02 MED ORDER — ONE-DAILY MULTI VITAMINS PO TABS: 1.0000 | ORAL_TABLET | Freq: Every day | ORAL | Status: DC

## 2015-07-02 MED ORDER — SODIUM CHLORIDE 0.9 % IV SOLN
INTRAVENOUS | Status: DC
  Administered 2015-07-02: 09:00:00 via INTRAVENOUS

## 2015-07-02 MED ORDER — LOSARTAN POTASSIUM-HCTZ 50-12.5 MG PO TABS: 1.0000 | ORAL_TABLET | Freq: Every day | ORAL | Status: DC

## 2015-07-02 MED ORDER — ASPIRIN EC 81 MG PO TBEC
81.0000 mg | DELAYED_RELEASE_TABLET | Freq: Every day | ORAL | Status: DC
  Administered 2015-07-03: 81 mg via ORAL
  Filled 2015-07-02: qty 1

## 2015-07-02 MED ORDER — LEVOTHYROXINE SODIUM 25 MCG PO TABS: 125.0000 ug | ORAL_TABLET | Freq: Every day | ORAL | Status: DC

## 2015-07-02 MED ORDER — MIDAZOLAM HCL 5 MG/5ML IJ SOLN
INTRAMUSCULAR | Status: DC | PRN
  Administered 2015-07-02: 1 mg via INTRAVENOUS
  Administered 2015-07-02: 2 mg via INTRAVENOUS

## 2015-07-02 MED ORDER — HEPARIN (PORCINE) IN NACL 2-0.9 UNIT/ML-% IJ SOLN
INTRAMUSCULAR | Status: AC
  Filled 2015-07-02: qty 500

## 2015-07-02 MED ORDER — FENTANYL CITRATE (PF) 100 MCG/2ML IJ SOLN
INTRAMUSCULAR | Status: AC
Start: 2015-07-02 — End: 2015-07-02
  Filled 2015-07-02: qty 2

## 2015-07-02 MED ORDER — LIDOCAINE HCL (PF) 1 % IJ SOLN
INTRAMUSCULAR | Status: AC
  Filled 2015-07-02: qty 60

## 2015-07-02 MED ORDER — VANCOMYCIN HCL IN DEXTROSE 1-5 GM/200ML-% IV SOLN
1000.0000 mg | INTRAVENOUS | Status: AC
  Administered 2015-07-02: 1000 mg via INTRAVENOUS

## 2015-07-02 MED ORDER — HEPARIN (PORCINE) IN NACL 2-0.9 UNIT/ML-% IJ SOLN
INTRAMUSCULAR | Status: DC | PRN
  Administered 2015-07-02: 500 mL

## 2015-07-02 MED ORDER — CALCIUM CARBONATE 1250 (500 CA) MG PO TABS
1.0000 | ORAL_TABLET | Freq: Every day | ORAL | Status: DC
  Administered 2015-07-03: 500 mg via ORAL
  Filled 2015-07-02: qty 1

## 2015-07-02 MED ORDER — PRAMIPEXOLE DIHYDROCHLORIDE 0.25 MG PO TABS: 0.1250 mg | ORAL_TABLET | Freq: Every day | ORAL | Status: DC

## 2015-07-02 MED ORDER — LIDOCAINE HCL (PF) 1 % IJ SOLN
INTRAMUSCULAR | Status: DC | PRN
  Administered 2015-07-02: 50 mL

## 2015-07-02 MED ORDER — ACETAMINOPHEN 325 MG PO TABS
325.0000 mg | ORAL_TABLET | ORAL | Status: DC | PRN
Start: 2015-07-02 — End: 2015-07-03
  Administered 2015-07-02: 650 mg via ORAL
  Filled 2015-07-02: qty 2

## 2015-07-02 MED ORDER — HYPROMELLOSE (GONIOSCOPIC) 2.5 % OP SOLN
1.0000 [drp] | Freq: Two times a day (BID) | OPHTHALMIC | Status: DC | PRN
  Filled 2015-07-02: qty 15

## 2015-07-02 MED ORDER — SODIUM CHLORIDE 0.9 % IR SOLN
80.0000 mg | Status: AC
  Administered 2015-07-02: 80 mg

## 2015-07-02 MED ORDER — ADULT MULTIVITAMIN W/MINERALS CH
1.0000 | ORAL_TABLET | Freq: Every day | ORAL | Status: DC
  Administered 2015-07-03: 1 via ORAL
  Filled 2015-07-02: qty 1

## 2015-07-02 MED ORDER — LOSARTAN POTASSIUM 50 MG PO TABS
50.0000 mg | ORAL_TABLET | Freq: Every day | ORAL | Status: DC
  Administered 2015-07-03: 50 mg via ORAL
  Filled 2015-07-02: qty 1

## 2015-07-02 MED ORDER — SODIUM CHLORIDE 0.9 % IR SOLN
Status: AC
  Filled 2015-07-02: qty 2

## 2015-07-02 MED ORDER — TRAZODONE HCL 100 MG PO TABS
100.0000 mg | ORAL_TABLET | Freq: Every day | ORAL | Status: DC
  Administered 2015-07-02: 100 mg via ORAL
  Filled 2015-07-02: qty 1

## 2015-07-02 MED ORDER — CEFOL PO TABS: 1.0000 | ORAL_TABLET | Freq: Every day | ORAL | Status: DC

## 2015-07-02 MED ORDER — CALCIUM CARBONATE 600 MG PO TABS: 600.0000 mg | ORAL_TABLET | Freq: Every day | ORAL | Status: DC

## 2015-07-02 MED ORDER — MUPIROCIN 2 % EX OINT: 1.0000 "application " | TOPICAL_OINTMENT | Freq: Once | CUTANEOUS | Status: DC

## 2015-07-02 MED ORDER — CARBIDOPA-LEVODOPA 25-100 MG PO TABS
2.0000 | ORAL_TABLET | Freq: Three times a day (TID) | ORAL | Status: DC
  Administered 2015-07-02 – 2015-07-03 (×3): 2 via ORAL
  Filled 2015-07-02 (×3): qty 2

## 2015-07-02 MED ORDER — OMEGA 3 1200 MG PO CAPS: 1.0000 | ORAL_CAPSULE | Freq: Every day | ORAL | Status: DC

## 2015-07-02 MED ORDER — LIFITEGRAST 5 % OP SOLN
1.0000 [drp] | Freq: Two times a day (BID) | OPHTHALMIC | Status: DC
  Administered 2015-07-03: 1 [drp] via OPHTHALMIC

## 2015-07-02 MED ORDER — LEVOTHYROXINE SODIUM 25 MCG PO TABS
125.0000 ug | ORAL_TABLET | Freq: Every day | ORAL | Status: DC
  Administered 2015-07-03: 125 ug via ORAL
  Filled 2015-07-02: qty 1

## 2015-07-02 MED ORDER — SODIUM CHLORIDE 0.9 % IV SOLN
INTRAVENOUS | Status: AC
  Administered 2015-07-02: 14:00:00 via INTRAVENOUS

## 2015-07-02 MED ORDER — HYDROCHLOROTHIAZIDE 12.5 MG PO CAPS
12.5000 mg | ORAL_CAPSULE | Freq: Every day | ORAL | Status: DC
  Administered 2015-07-03: 12.5 mg via ORAL
  Filled 2015-07-02: qty 1

## 2015-07-02 MED ORDER — OMEGA-3-ACID ETHYL ESTERS 1 G PO CAPS
1.0000 g | ORAL_CAPSULE | Freq: Every day | ORAL | Status: DC
  Administered 2015-07-03: 1 g via ORAL
  Filled 2015-07-02: qty 1

## 2015-07-02 SURGICAL SUPPLY — 7 items
CABLE SURGICAL S-101-97-12 (CABLE) ×2 IMPLANT
LEAD CAPSURE NOVUS 5076-52CM (Lead) ×2 IMPLANT
LEAD CAPSURE NOVUS 5076-58CM (Lead) ×2 IMPLANT
PAD DEFIB LIFELINK (PAD) ×2 IMPLANT
PPM ADVISA MRI DR A2DR01 (Pacemaker) ×2 IMPLANT
SHEATH CLASSIC 7F (SHEATH) ×4 IMPLANT
TRAY PACEMAKER INSERTION (PACKS) ×2 IMPLANT

## 2015-07-02 NOTE — Progress Notes (Signed)
PHARMACIST - PHYSICIAN ORDER COMMUNICATION  CONCERNING: P&T Medication Policy on Herbal Medications  DESCRIPTION:  This patient's order for:  Alpha-lipoic acid capsules has been noted.  This product(s) is classified as an "herbal" or natural product. Due to a lack of definitive safety studies or FDA approval, nonstandard manufacturing practices, plus the potential risk of unknown drug-drug interactions while on inpatient medications, the Pharmacy and Therapeutics Committee does not permit the use of "herbal" or natural products of this type within [facility].   ACTION TAKEN: The pharmacy department is unable to verify this order at this time and your patient has been informed of this safety policy. Please reevaluate patient's clinical condition at discharge and address if the herbal or natural product(s) should be resumed at that time.  Link SnufferJessica Jabori Henegar, PharmD, BCPS Clinical Pharmacist [phone] 07/02/2015, 2:53 PM

## 2015-07-02 NOTE — Discharge Summary (Signed)
ELECTROPHYSIOLOGY PROCEDURE DISCHARGE SUMMARY    Patient ID: Christina [name],  MRN: [id number], DOB/AGE: 11/17/1945 70 y.o.  Admit date: 07/02/2015 Discharge date: 07/02/2015  Primary Care Physician: Wynona DoveWALKER,JENNIFER AZBELL, MD Primary Cardiologist: Mariah MillingGollan Electrophysiologist: Graciela HusbandsKlein  Primary Discharge Diagnosis:  Symptomatic bradycardia status post pacemaker implantation this admission  Secondary Discharge Diagnosis:  1.  Parkinsons 2.  Hypertension 3.  Hypothyroidism 4.  LBBB  Allergies  Allergen Reactions  . Lisinopril-Hydrochlorothiazide Other (See Comments)    Post nasal drainage  . Penicillins Rash     Procedures This Admission:  1.  Implantation of a MDT dual chamber PPM on 07/02/15 by Dr Graciela HusbandsKlein.  The patient received a MDT model number Advisa PPM with model number 5076 right atrial lead and 5076 right ventricular lead. There were no immediate post procedure complications. 2.  CXR on 07/03/15 demonstrated no pneumothorax status post device implantation.   Brief HPI: Christina [name] is a 70 y.o. female who was [facility] by Dr Graciela HusbandsKlein following syncopal event. She was found to be bradycardic with new LBBB.  Risks, benefits, and alternatives to PPM implantation were reviewed with the patient who wished to proceed.   Hospital Course:  The patient was admitted and underwent implantation of a MDT MRI compatible dual chamber pacemaker with details as outlined above.  She  was monitored on telemetry overnight which demonstrated nsr with ventricular pacing.  Left chest was without hematoma or ecchymosis.  The device was interrogated and found to be functioning normally.  CXR was obtained and demonstrated no pneumothorax status post device implantation.  Wound care, arm mobility, and restrictions were reviewed with the patient.  The patient was examined and considered stable for discharge to home.    Physical Exam: Filed Vitals:   07/02/15  0811 07/02/15 1235 07/02/15 1318 07/02/15 1356  BP: 158/55   129/44  Pulse: 53   67  Temp: 98.4 F (36.9 C)   97.6 F (36.4 C)  TempSrc: Oral   Oral  Resp: 18     Height: 5\' 6"  (1.676 m)     Weight: 186 lb (84.369 kg)     SpO2: 97% 100% 94% 97%    GEN- The patient is well appearing, alert and oriented x 3 today.   HEENT: normocephalic, atraumatic; sclera clear, conjunctiva pink; hearing intact; oropharynx clear; neck supple  Lungs- Clear to ausculation bilaterally, normal work of breathing.  No wheezes, rales, rhonchi, no hematoma Heart- Regular rate and rhythm, no murmurs, rubs or gallops  GI- soft, non-tender, non-distended, bowel sounds present  Extremities- no clubbing, cyanosis, or edema; DP/PT/radial pulses 2+ bilaterally MS- no significant deformity or atrophy Skin- warm and dry, no rash or lesion, left chest without hematoma/ecchymosis Psych- euthymic mood, full affect Neuro- strength and sensation are intact   Labs:   Lab Results  Component Value Date   WBC 6.0 06/26/2015   HGB 13.3 06/26/2015   HCT 39.7 06/26/2015   MCV 87.7 06/26/2015   PLT 184 06/26/2015     Recent Labs Lab 06/26/15 0500  NA 138  K 3.8  CL 105  CO2 27  BUN 20  CREATININE 0.94  CALCIUM 8.8*  GLUCOSE 98    Discharge Medications:    Medication List    ASK your doctor about these medications        Alpha-Lipoic Acid 200 MG Caps  Take 200 mg by mouth 3 (three) times daily.     aspirin  81 MG EC tablet  Take 1 tablet (81 mg total) by mouth daily.     calcium carbonate 600 MG Tabs tablet  Commonly known as:  OS-CAL  Take 600 mg by mouth daily.     carbidopa-levodopa 25-100 MG tablet  Commonly known as:  SINEMET IR  Take 2 tablets by mouth 3 (three) times daily.     fexofenadine 180 MG tablet  Commonly known as:  ALLEGRA  Take 1 tablet (180 mg total) by mouth daily.     hydroxypropyl methylcellulose / hypromellose 2.5 % ophthalmic solution  Commonly known as:  ISOPTO TEARS  / GONIOVISC  Place 1 drop into both eyes 2 (two) times daily as needed for dry eyes.     levothyroxine 125 MCG tablet  Commonly known as:  SYNTHROID, LEVOTHROID  Take 1 tablet (125 mcg total) by mouth daily.     losartan-hydrochlorothiazide 50-12.5 MG tablet  Commonly known as:  HYZAAR  TAKE ONE TABLET BY MOUTH ONCE DAILY     multivitamin tablet  Take 1 tablet by mouth daily. Reported on 03/12/2015     multivitamin, stress formula tablet  Take 1 tablet by mouth daily. Reported on 03/12/2015     Omega 3 1200 MG Caps  Take 1 capsule by mouth daily.     pramipexole 0.25 MG tablet  Commonly known as:  MIRAPEX  Take 0.125-0.25 mg by mouth daily. Pt. Takes 0.5 tablet (0.125mg ) in the morning, 0.5 tablet (0.125mg ) at lunch, then takes a whole tablet (0.25mg ) at bedtime.     traZODone 100 MG tablet  Commonly known as:  DESYREL  Take 100 mg by mouth at bedtime.     XIIDRA 5 % Soln  Generic drug:  Lifitegrast  Place 1 drop into both eyes 2 (two) times daily.        Disposition:   Follow-up Information    Follow up with [facility] On 07/12/2015.   Specialty:  Cardiology   Why:  9:30AM, wound check   Contact information:   [address] [place] [address] [phone]      Follow up with Sherryl Manges, MD On 10/02/2015.   Specialty:  Cardiology   Why:  at 10:30AM    Contact information:   [address] [place] [address] [phone]       Duration of Discharge Encounter: Greater than 30 minutes including physician time.  Signed, Gypsy Balsam, NP 07/02/2015 3:09 PM  EP Attending  Patient seen and examined. Agree with above. She is doing well s/p PPM insertion. I have reviewed her CXR and her interogation. Her DDD PM is working normally. Was reprogrammed for rate response turned on. She is stable for DC. Usual followup.   Leonia Reeves.D.

## 2015-07-02 NOTE — Interval H&P Note (Signed)
History and Physical Interval Note:  07/02/2015 10:40 AM  Christina LoronNancy Ruth Lund  has presented today for surgery, with the diagnosis of sinus node dysfunction   bradycardia  The various methods of treatment have been discussed with the patient and family. After consideration of risks, benefits and other options for treatment, the patient has consented to  Procedure(s): Pacemaker Implant (N/A) as a surgical intervention .  The patient's history has been reviewed, patient examined, no change in status, stable for surgery.  I have reviewed the patient's chart and labs.  Questions were answered to the patient's satisfaction.     Sherryl MangesSteven Canden Cieslinski

## 2015-07-02 NOTE — H&P (View-Only) (Signed)
ELECTROPHYSIOLOGY CONSULT NOTE  Patient ID: Christina [name], MRN: [id number], DOB/AGE: 68-Jan-1947 68 y.o. Admit date: 06/25/2015 Date of Consult: 06/26/2015  Primary Physician: Wynona Dove, MD Primary Cardiologist: TG  Chief Complaint: syncope   HPI Christina [name] is a 70 y.o. female evaluated for syncope.  Her episode was characterized by the abrupt loss of consciousness. She fell backwards. Her eyes were open and she was arousable 5-10 seconds later and was able, with her husband's assistance, to ambulate back to the house. At that point she remained confused as to what day it was, what she had been doing. Blood pressure was recorded shortly thereafter and was in the 160 range; heart rates were noted to be low.  ECG was normal in 2014 prior to the initiation of her Parkinson medications which include pramipexole and carbidopa. These have been very effective in controlling her Parkinson symptoms.  She denies a history of exercise intolerance. She's had no prior syncope. She had noted positional lightheadedness a day or 2 before this syncopal episode; she also had a headache a couple of days.  Echocardiogram demonstrates normal LV function  Telemetry demonstrates sinus node dysfunction with bradycardia into the high 30s; she has first-degree AV block and left bundle branch block new from 2014  Past Medical History  Diagnosis Date  . Vertigo   . Sinus infection   . Hypothyroidism     hypothyroidism  . Diverticula of intestine   . Parkinson's disease (HCC)   . Essential hypertension       Surgical History:  Past Surgical History  Procedure Laterality Date  . Tubal ligation  1988  . Thyroidectomy  1976  . Gallbladder surgery  2005  . Vaginal delivery       Home Meds: Prior to Admission medications   Medication Sig Start Date End Date Taking? Authorizing Provider  Alpha-Lipoic Acid 200 MG CAPS Take 200 mg by mouth 3 (three) times daily.    Yes  Historical Provider, MD  B Complex-C-Folic Acid (MULTIVITAMIN, STRESS FORMULA) tablet Take 1 tablet by mouth daily. Reported on 03/12/2015   Yes Historical Provider, MD  calcium carbonate (OS-CAL) 600 MG TABS Take 600 mg by mouth daily.     Yes Historical Provider, MD  carbidopa-levodopa (SINEMET IR) 25-100 MG tablet Take 2 tablets by mouth 3 (three) times daily.  12/28/14 12/28/15 Yes Historical Provider, MD  fexofenadine (ALLEGRA) 180 MG tablet Take 1 tablet (180 mg total) by mouth daily. 06/24/12 06/25/15 Yes Shelia Media, MD  hydroxypropyl methylcellulose / hypromellose (ISOPTO TEARS / GONIOVISC) 2.5 % ophthalmic solution Place 1 drop into both eyes 2 (two) times daily as needed for dry eyes.   Yes Historical Provider, MD  levothyroxine (SYNTHROID, LEVOTHROID) 125 MCG tablet Take 1 tablet (125 mcg total) by mouth daily. 03/12/15  Yes Shelia Media, MD  Lifitegrast Benay Spice) 5 % SOLN Place 1 drop into both eyes 2 (two) times daily.   Yes Historical Provider, MD  losartan-hydrochlorothiazide (HYZAAR) 50-12.5 MG tablet TAKE ONE TABLET BY MOUTH ONCE DAILY 06/04/15  Yes Shelia Media, MD  Multiple Vitamin (MULTIVITAMIN) tablet Take 1 tablet by mouth daily. Reported on 03/12/2015   Yes Historical Provider, MD  Omega 3 1200 MG CAPS Take 1 capsule by mouth daily.   Yes Historical Provider, MD  pramipexole (MIRAPEX) 0.25 MG tablet Take 0.125-0.25 mg by mouth daily. Pt. Takes 0.5 tablet in the morning at at 1400, then takes a whole tablet at bedtime. 02/16/14 06/25/15  Yes Historical Provider, MD  traZODone (DESYREL) 100 MG tablet Take 100 mg by mouth at bedtime.  12/28/14 12/23/15 Yes Historical Provider, MD    Inpatient Medications:  . aspirin EC  81 mg Oral Daily  . calcium carbonate  500 mg Oral Daily  . carbidopa-levodopa  2 tablet Oral TID  . enoxaparin (LOVENOX) injection  40 mg Subcutaneous Q24H  . levothyroxine  125 mcg Oral QAC breakfast  . loratadine  10 mg Oral Daily  . multivitamin with  minerals  1 tablet Oral Daily  . omega-3 acid ethyl esters  1 capsule Oral Daily  . pramipexole  0.125 mg Oral BID  . pramipexole  0.25 mg Oral QHS  . sodium chloride flush  3 mL Intravenous Q12H  . traZODone  100 mg Oral QHS      Allergies:  Allergies  Allergen Reactions  . Lisinopril-Hydrochlorothiazide Other (See Comments)    Post nasal drainage  . Penicillins Rash    Social History   Social History  . Marital Status: Married    Spouse Name: N/A  . Number of Children: 2  . Years of Education: N/A   Occupational History  . 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     Social History Main Topics  . Smoking status: Never Smoker   . Smokeless tobacco: Never Used  . Alcohol Use: 0.0 oz/week    0 Standard drinks or equivalent per week     Comment: occasional/rare glass of wine.  . Drug Use: No  . Sexual Activity: Not Currently   Other Topics Concern  . Not on file   Social History Narrative   Regular Exercise -  YES, twice a week   Daily Caffeine Use:  2 cups coffee   Lives with husband in [place]. No pets. Work [employer] in [employer].      Family History  Problem Relation Age of Onset  . Lung cancer Mother   . Diabetes Mother   . Hypertension Mother   . Cancer Mother     lung  . Lung cancer Father   . Cancer Father     lung  . Diabetes Maternal Grandmother   . Bradycardia Father      ROS:  Please see the history of present illness.     All other systems reviewed and negative.    Physical Exam:   Blood pressure 102/39, pulse 43, temperature 98 F (36.7 C), temperature source Oral, resp. rate 18, height 5\' 6"  (1.676 m), weight 193 lb 9.6 oz (87.816 kg), SpO2 95 %. General: Well developed, well nourished female in no acute distress. Head: Normocephalic, atraumatic, sclera non-icteric, no xanthomas, nares are without discharge. EENT: normal Lymph Nodes:  none Back: without scoliosis/kyphosis , no CVA tendersness Neck: Negative for carotid bruits. JVD not  elevated. Lungs: Clear bilaterally to auscultation without wheezes, rales, or rhonchi. Breathing is unlabored. Heart: RRR with soft S1 S2. No  murmur , rubs, or gallops appreciated. Abdomen: Soft, non-tender, non-distended with normoactive bowel sounds. No hepatomegaly. No rebound/guarding. No obvious abdominal masses. Msk:  Strength and tone appear normal for age. Extremities: No clubbing or cyanosis. No  edema.  Distal pedal pulses are 2+ and equal bilaterally. Skin: Warm and Dry Neuro: Alert and oriented X 3. CN III-XII intact Grossly normal sensory and motor function . Psych:  Responds to questions appropriately with a normal affect.      Labs: Cardiac Enzymes  Recent Labs  06/25/15 1219 06/25/15  1606 06/25/15 2131  TROPONINI <0.03 <0.03 <0.03   CBC Lab Results  Component Value Date   WBC 6.0 06/26/2015   HGB 13.3 06/26/2015   HCT 39.7 06/26/2015   MCV 87.7 06/26/2015   PLT 184 06/26/2015   PROTIME: No results for input(s): LABPROT, INR in the last 72 hours. Chemistry  Recent Labs Lab 06/26/15 0500  NA 138  K 3.8  CL 105  CO2 27  BUN 20  CREATININE 0.94  CALCIUM 8.8*  GLUCOSE 98   Lipids Lab Results  Component Value Date   CHOL 221* 03/12/2015   HDL 55.70 03/12/2015   LDLCALC 132* 03/12/2015   TRIG 165.0* 03/12/2015   BNP No results found for: PROBNP Thyroid Function Tests:  Recent Labs  06/25/15 1636  TSH 1.827      Miscellaneous No results found for: DDIMER  Radiology/Studies:  Dg Chest 1 View  06/25/2015  CLINICAL DATA:  Syncopal episode.  Loss of consciousness. EXAM: CHEST 1 VIEW COMPARISON:  None. FINDINGS: Cardiac silhouette is upper limits of normal. The trachea is midline. The lungs are clear without pulmonary edema or focal airspace disease. Negative for a pneumothorax. Bone structures are unremarkable. IMPRESSION: No acute cardiopulmonary disease. Electronically Signed   By: Richarda Overlie M.D.   On: 06/25/2015 13:24   Ct Head Wo  Contrast  06/25/2015  CLINICAL DATA:  Syncope with loss of consciousness while gardening with husband. EXAM: CT HEAD WITHOUT CONTRAST CT CERVICAL SPINE WITHOUT CONTRAST TECHNIQUE: Multidetector CT imaging of the head and cervical spine was performed following the standard protocol without intravenous contrast. Multiplanar CT image reconstructions of the cervical spine were also generated. COMPARISON:  03/20/2007 FINDINGS: CT HEAD FINDINGS Mild low attenuation within the subcortical and periventricular white matter is identified. There is no evidence for acute cortical infarct, intracranial hemorrhage or mass. CT CERVICAL SPINE FINDINGS Straightening of normal cervical lordosis is likely related to patient positioning. The vertebral body heights are well preserved. The facet joints are well-aligned. Disc space narrowing and ventral endplate spurring is identified C4-5 and C5-6. Carotid artery calcifications are identified. No fractures or subluxations identified. IMPRESSION: 1. No acute intracranial abnormalities. 2. No evidence for cervical spine fracture or subluxation. Electronically Signed   By: Signa Kell M.D.   On: 06/25/2015 14:04   Ct Cervical Spine Wo Contrast  06/25/2015  CLINICAL DATA:  Syncope with loss of consciousness while gardening with husband. EXAM: CT HEAD WITHOUT CONTRAST CT CERVICAL SPINE WITHOUT CONTRAST TECHNIQUE: Multidetector CT imaging of the head and cervical spine was performed following the standard protocol without intravenous contrast. Multiplanar CT image reconstructions of the cervical spine were also generated. COMPARISON:  03/20/2007 FINDINGS: CT HEAD FINDINGS Mild low attenuation within the subcortical and periventricular white matter is identified. There is no evidence for acute cortical infarct, intracranial hemorrhage or mass. CT CERVICAL SPINE FINDINGS Straightening of normal cervical lordosis is likely related to patient positioning. The vertebral body heights are well  preserved. The facet joints are well-aligned. Disc space narrowing and ventral endplate spurring is identified C4-5 and C5-6. Carotid artery calcifications are identified. No fractures or subluxations identified. IMPRESSION: 1. No acute intracranial abnormalities. 2. No evidence for cervical spine fracture or subluxation. Electronically Signed   By: Signa Kell M.D.   On: 06/25/2015 14:04    EKG: sinus with r5ates 35-70 different p wave Intervals 24/15/47 Left bundle branch block     Assessment and Plan:   Syncope  Left bundle branch block  Sinus  node dysfunction    first degree AV block  Parkinson's disease   The abrupt onset of her syncope would suggest an arrhythmic manifestation. The prolonged recovery phase and its accompanying confusion would speak against a primary arrhythmic event and raises the specter of a neurological event, particularly the confusion. However, the response to syncope in the context of Parkinson's disease and accompanied by her neurological medications is not something for which I have any experience and I wonder whether they might effect the recovery phase of a cardiac/arrhythmic syncopal event  It is noteworthy that his antiparkinson medications are frequently associated with orthostatic intolerance and neurally mediated syncope.;  Obviously, in the context of the new ECG abnormalities noted since 2014 it is hard to be dismissive of a cardiac arrhythmic cause however.  Literature search demonstrates infrequent but reported episodes of heart block and sinus bradycardia related to pramipexole, these reversed with the discontinuation of the drug. We would need to defer to neurology whether there are alternative treatments for her Parkinson's. The patient is enthusiastic about staying on these drugs and would probably prefer pacing as opposed to discontinuing the medications.  Both heart block and sinus node dysfunction have been associated with pramipexole  and carbidopa.; this, in the context of her normal echo, makes it unlikely that there is underlying structural heart disease that needs to be further explored.  Hence, following neurological evaluation and recommendations regarding the sustaining of her anti Parkinson medications further cardiac recommendations will follow.       Sherryl MangesSteven Fue Cervenka

## 2015-07-02 NOTE — Progress Notes (Addendum)
Orthopedic Tech Progress Note Patient Details:  Christina [name] 1945/05/12 [id number]  Patient ID: Hazel SamsNancy Ruth [name], female   DOB: 1945/05/12, 70 y.o.   MRN: [id number] Pt. Already had arm sling  [name], [name] 07/02/2015, 9:28 PM

## 2015-07-03 ENCOUNTER — Ambulatory Visit (HOSPITAL_COMMUNITY): Payer: Medicare HMO

## 2015-07-03 DIAGNOSIS — R55 Syncope and collapse: Secondary | ICD-10-CM | POA: Diagnosis not present

## 2015-07-03 DIAGNOSIS — I455 Other specified heart block: Secondary | ICD-10-CM

## 2015-07-03 DIAGNOSIS — I440 Atrioventricular block, first degree: Secondary | ICD-10-CM | POA: Diagnosis not present

## 2015-07-03 DIAGNOSIS — I447 Left bundle-branch block, unspecified: Secondary | ICD-10-CM | POA: Diagnosis not present

## 2015-07-03 DIAGNOSIS — I495 Sick sinus syndrome: Secondary | ICD-10-CM | POA: Diagnosis not present

## 2015-07-03 NOTE — Progress Notes (Signed)
Discussed with the patient and husband and all questioned fully answered. She will call me if any problems arise. Educated patient on driving and lifting restrictions and wound care. CCMD notified, IV removed.  Leonidas Rombergaitlin S Bumbledare, RN

## 2015-07-03 NOTE — Discharge Instructions (Signed)
° ° °  Supplemental Discharge Instructions for  Pacemaker/Defibrillator Patients  Activity No heavy lifting or vigorous activity with your left/right arm for 6 to 8 weeks.  Do not raise your left/right arm above your head for one week.  Gradually raise your affected arm as drawn below.             07/06/15                     07/07/15                    07/08/15                  07/09/15  NO DRIVING for  1 week   ; you may begin driving on   1/61/095/22/17  . (as per Dr. Graciela HusbandsKlein)  WOUND CARE - Keep the wound area clean and dry.  Do not get this area wet for 24 hours. No showers for 24 hours; you may shower on  07/03/15    . - The tape/steri-strips on your wound will fall off; do not pull them off.  No bandage is needed on the site.  DO  NOT apply any creams, oils, or ointments to the wound area. - If you notice any drainage or discharge from the wound, any swelling or bruising at the site, or you develop a fever > 101? F after you are discharged home, call the office at once.  Special Instructions - You are still able to use cellular telephones; use the ear opposite the side where you have your pacemaker/defibrillator.  Avoid carrying your cellular phone near your device. - When traveling through airports, show security personnel your identification card to avoid being screened in the metal detectors.  Ask the security personnel to use the hand wand. - Avoid arc welding equipment, TENS units (transcutaneous nerve stimulators).  Call the office for questions about other devices. - Avoid electrical appliances that are in poor condition or are not properly grounded. - Microwave ovens are safe to be near or to operate.

## 2015-07-04 ENCOUNTER — Ambulatory Visit: Payer: Medicare HMO | Admitting: Internal Medicine

## 2015-07-12 ENCOUNTER — Telehealth: Payer: Self-pay | Admitting: *Deleted

## 2015-07-12 ENCOUNTER — Ambulatory Visit (INDEPENDENT_AMBULATORY_CARE_PROVIDER_SITE_OTHER): Payer: Medicare HMO | Admitting: *Deleted

## 2015-07-12 ENCOUNTER — Encounter: Payer: Self-pay | Admitting: Internal Medicine

## 2015-07-12 ENCOUNTER — Ambulatory Visit
Admission: RE | Admit: 2015-07-12 | Discharge: 2015-07-12 | Disposition: A | Discharge status: Home / Self Care | Payer: Medicare HMO | Source: Ambulatory Visit | Attending: Nurse Practitioner | Admitting: Nurse Practitioner

## 2015-07-12 DIAGNOSIS — Z959 Presence of cardiac and vascular implant and graft, unspecified: Secondary | ICD-10-CM | POA: Diagnosis not present

## 2015-07-12 LAB — CUP PACEART INCLINIC DEVICE CHECK
Brady Statistic AP VP Percent: 0.05 %
Brady Statistic AP VS Percent: 54.51 %
Brady Statistic AS VP Percent: 0.11 %
Brady Statistic AS VS Percent: 45.33 %
Date Time Interrogation Session: 20170525112508
Implantable Lead Implant Date: 20170515
Implantable Lead Location: 753860
Lead Channel Impedance Value: 361 Ohm
Lead Channel Impedance Value: 456 Ohm
Lead Channel Impedance Value: 494 Ohm
Lead Channel Pacing Threshold Amplitude: 2.75 V
Lead Channel Pacing Threshold Pulse Width: 0.4 ms
Lead Channel Sensing Intrinsic Amplitude: 12.625 mV
Lead Channel Setting Pacing Amplitude: 3.5 V
Lead Channel Setting Pacing Pulse Width: 0.4 ms
Lead Channel Setting Sensing Sensitivity: 2.8 mV
MDC IDC LEAD IMPLANT DT: 20170515
MDC IDC LEAD LOCATION: 753859
MDC IDC MSMT BATTERY VOLTAGE: 3.07 V
MDC IDC MSMT LEADCHNL RA IMPEDANCE VALUE: 399 Ohm
MDC IDC MSMT LEADCHNL RA PACING THRESHOLD AMPLITUDE: 0.5 V
MDC IDC MSMT LEADCHNL RA SENSING INTR AMPL: 4.375 mV
MDC IDC MSMT LEADCHNL RV PACING THRESHOLD PULSEWIDTH: 0.4 ms
MDC IDC SET LEADCHNL RV PACING AMPLITUDE: 5 V
MDC IDC STAT BRADY RA PERCENT PACED: 54.56 %
MDC IDC STAT BRADY RV PERCENT PACED: 0.16 %

## 2015-07-12 NOTE — Telephone Encounter (Signed)
-----   Message from Marily LenteAmber K Seiler, NP sent at 07/12/2015  1:09 PM EDT ----- RV lead appears to have migrated some on lateral view. If no chest pain or shortness of breath, would keep appt in 4 weeks with Dr Graciela HusbandsKlein and recheck thresholds at that time.

## 2015-07-12 NOTE — Telephone Encounter (Signed)
Called patient to make her aware of CXR results.  She denies any symptoms, including chest pain or SOB.  Patient is agreeable to keeping 4 week f/u appointment and is appreciative of call.  Patient is aware to call in interim with worsening symptoms, questions, or concerns.

## 2015-07-12 NOTE — Progress Notes (Signed)
Wound check appointment. Dermabond removed. Wound without redness or edema. Incision edges approximated but noted small superficial abrasion (from dermabond removal) above medial incision and right lateral incision. Normal device function. RA Threshold, sensing, and impedances consistent with implant measurements.  RV threshold now 2.75@0 .4ms. RV at implant 1.0@ 0.505ms. Per AS pt to have CXR, f/u with device clinic (SK in office)  in 4weeks.  RV programmed at 5.0V for 2:1 safety margin. RA programmed at 3.5V. Histogram distribution appropriate for patient and level of activity. No mode switches or high ventricular rates noted. Patient educated about wound care, arm mobility, lifting restrictions. ROV with Device in 4weeks, with SK in office.

## 2015-07-23 ENCOUNTER — Telehealth: Payer: Self-pay | Admitting: Internal Medicine

## 2015-07-23 NOTE — Telephone Encounter (Signed)
New message    Pt c/o of Chest Pain: STAT if CP now or developed within 24 hours  1. Are you having CP right now? No, the pt is stating the pain is under the arm where pacemaker has been in for 3 weeks  2. Are you experiencing any other symptoms (ex. SOB, nausea, vomiting, sweating)? no  3. How long have you been experiencing CP? Per pt not really cp the pain is under the arm(left) where the pacemaker is at  4. Is your CP continuous or coming and going? Coming and going pain, under the arm on the left  5. Have you taken Nitroglycerin? No    The pt states when she sleeps and goes to the left there is a pain under arm, the pt states she does not have a lot of energy which she is concerned about. ?

## 2015-07-23 NOTE — Telephone Encounter (Signed)
Describes pain underneath her armpit which is aggravated by flexion of her left arm towards her right shoulder. I offered her an appointment tomorrow and Thursday. She says she may just feel like waiting until her appointment 6/21. I've advised her to call me if she has more concerns or symptoms

## 2015-07-26 ENCOUNTER — Encounter: Payer: Medicare HMO | Admitting: Internal Medicine

## 2015-08-08 ENCOUNTER — Ambulatory Visit (INDEPENDENT_AMBULATORY_CARE_PROVIDER_SITE_OTHER): Payer: Medicare HMO | Admitting: *Deleted

## 2015-08-08 DIAGNOSIS — I455 Other specified heart block: Secondary | ICD-10-CM

## 2015-08-08 LAB — CUP PACEART INCLINIC DEVICE CHECK
Battery Remaining Longevity: 130 mo
Battery Voltage: 3.04 V
Brady Statistic AP VP Percent: 0.12 %
Brady Statistic AP VS Percent: 80.71 %
Brady Statistic AS VP Percent: 0.04 %
Brady Statistic AS VS Percent: 19.13 %
Brady Statistic RA Percent Paced: 80.83 %
Brady Statistic RV Percent Paced: 0.16 %
Date Time Interrogation Session: 20170621101120
Implantable Lead Implant Date: 20170515
Implantable Lead Implant Date: 20170515
Implantable Lead Location: 753859
Implantable Lead Location: 753860
Implantable Lead Model: 5076
Implantable Lead Model: 5076
Lead Channel Impedance Value: 361 Ohm
Lead Channel Impedance Value: 399 Ohm
Lead Channel Impedance Value: 456 Ohm
Lead Channel Impedance Value: 475 Ohm
Lead Channel Pacing Threshold Amplitude: 0.5 V
Lead Channel Pacing Threshold Amplitude: 2.75 V
Lead Channel Pacing Threshold Amplitude: 2.75 V
Lead Channel Pacing Threshold Pulse Width: 0.4 ms
Lead Channel Pacing Threshold Pulse Width: 0.4 ms
Lead Channel Pacing Threshold Pulse Width: 1 ms
Lead Channel Sensing Intrinsic Amplitude: 17.5 mV
Lead Channel Setting Pacing Amplitude: 3.5 V
Lead Channel Setting Pacing Amplitude: 5 V
Lead Channel Setting Pacing Pulse Width: 1 ms
Lead Channel Setting Sensing Sensitivity: 2.8 mV

## 2015-08-08 NOTE — Progress Notes (Signed)
Pacemaker check in clinic. Normal device function. RA threshold, sensing, impedances consistent with previous measurements. Elevated RV threshold noted--lead migration seen on most recent cxr. SK aware--no interventions needed; implanted for sinus arrest (<0.1%RVp). Device programmed to maximize longevity. No mode switch or high ventricular rates noted. Device programmed at appropriate safety margins. Histogram distribution appropriate for patient activity level. Device programmed to optimize intrinsic conduction. Estimated longevity 10.5 years. Patient will follow up with SK/B on 8/15 as scheduled.

## 2015-09-10 ENCOUNTER — Ambulatory Visit: Payer: Medicare Other | Admitting: Internal Medicine

## 2015-09-18 ENCOUNTER — Ambulatory Visit (INDEPENDENT_AMBULATORY_CARE_PROVIDER_SITE_OTHER): Payer: Medicare HMO | Admitting: Family

## 2015-09-18 ENCOUNTER — Encounter: Payer: Self-pay | Admitting: Family

## 2015-09-18 VITALS — BP 124/72 | HR 80 | Wt 192.0 lb

## 2015-09-18 DIAGNOSIS — N3946 Mixed incontinence: Secondary | ICD-10-CM | POA: Diagnosis not present

## 2015-09-18 DIAGNOSIS — N393 Stress incontinence (female) (male): Secondary | ICD-10-CM | POA: Diagnosis not present

## 2015-09-18 NOTE — Patient Instructions (Addendum)
Pleasure meeting you.  If there is no improvement in your symptoms, or if there is any worsening of symptoms, or if you have any additional concerns, please return for re-evaluation; or, if we are closed, consider going to the Emergency Room for evaluation if symptoms urgent.

## 2015-09-18 NOTE — Assessment & Plan Note (Signed)
Symptoms consistent with stress and urge incontinence. As kegels havent demonstrated much relief, we jointly decided on consult with urology. Concern for antispasmodics and anticholingeric side effects in context of Parkinson's.

## 2015-09-18 NOTE — Progress Notes (Signed)
Subjective:    Patient ID: Christina [name], female    DOB: 09-Jun-1945, 70 y.o.   MRN: [id number]  CC: Christina [name] is a 70 y.o. female who presents today for follow up.    HPI: Patient here for follow-up on urinary incontinence for the past year, worsening. Notices dribble with cough, laugh, running water. Endorses urgency. No fever, dysuria.  Parkinson's ( diagnosed 2 years ago) and follows with Christina [name], neurology. Tried kegels without relief. Vaginal childbirths.    HISTORY:  Past Medical History:  Diagnosis Date  . Diverticula of intestine   . Essential hypertension   . Hypothyroidism    hypothyroidism  . Parkinson's disease (HCC)   . Sinus infection   . Vertigo    Past Surgical History:  Procedure Laterality Date  . EP IMPLANTABLE DEVICE N/A 07/02/2015   Procedure: Pacemaker Implant;  Surgeon: Christina Salvia, MD;  Location: [facility] INVASIVE CV LAB;  Service: Cardiovascular;  Laterality: N/A;  . GALLBLADDER SURGERY  2005  . THYROIDECTOMY  1976  . TUBAL LIGATION  1988  . VAGINAL DELIVERY     Family History  Problem Relation Age of Onset  . Lung cancer Mother   . Diabetes Mother   . Hypertension Mother   . Cancer Mother     lung  . Lung cancer Father   . Cancer Father     lung  . Diabetes Maternal Grandmother   . Bradycardia Father     Allergies: Lisinopril-hydrochlorothiazide and Penicillins Current Outpatient Prescriptions on File Prior to Visit  Medication Sig Dispense Refill  . Alpha-Lipoic Acid 200 MG CAPS Take 200 mg by mouth 3 (three) times daily.     Marland Kitchen aspirin EC 81 MG EC tablet Take 1 tablet (81 mg total) by mouth daily. 30 tablet 2  . B Complex-C-Folic Acid (MULTIVITAMIN, STRESS FORMULA) tablet Take 1 tablet by mouth daily. Reported on 03/12/2015    . calcium carbonate (OS-CAL) 600 MG TABS Take 600 mg by mouth daily.      . carbidopa-levodopa (SINEMET IR) 25-100 MG tablet Take 2 tablets by mouth 3 (three) times daily.     . hydroxypropyl  methylcellulose / hypromellose (ISOPTO TEARS / GONIOVISC) 2.5 % ophthalmic solution Place 1 drop into both eyes 2 (two) times daily as needed for dry eyes.    Marland Kitchen levothyroxine (SYNTHROID, LEVOTHROID) 125 MCG tablet Take 1 tablet (125 mcg total) by mouth daily. 90 tablet 3  . Lifitegrast (XIIDRA) 5 % SOLN Place 1 drop into both eyes 2 (two) times daily.    Marland Kitchen losartan-hydrochlorothiazide (HYZAAR) 50-12.5 MG tablet TAKE ONE TABLET BY MOUTH ONCE DAILY 90 tablet 3  . Multiple Vitamin (MULTIVITAMIN) tablet Take 1 tablet by mouth daily. Reported on 03/12/2015    . Omega 3 1200 MG CAPS Take 1 capsule by mouth daily.    . traZODone (DESYREL) 100 MG tablet Take 100 mg by mouth at bedtime.     . fexofenadine (ALLEGRA) 180 MG tablet Take 1 tablet (180 mg total) by mouth daily. (Patient taking differently: Take 180 mg by mouth daily as needed. ) 90 tablet 4  . pramipexole (MIRAPEX) 0.25 MG tablet Take 0.125-0.25 mg by mouth daily. Pt. Takes 0.5 tablet (0.125mg ) in the morning, 0.5 tablet (0.125mg ) at lunch, then takes a whole tablet (0.25mg ) at bedtime.     No current facility-administered medications on file prior to visit.     Social History  Substance Use Topics  . Smoking status: Never Smoker  .  Smokeless tobacco: Never Used  . Alcohol use 0.0 oz/week     Comment: occasional/rare glass of wine.    Review of Systems  Constitutional: Negative for chills and fever.  Respiratory: Negative for cough.   Cardiovascular: Negative for chest pain and palpitations.  Gastrointestinal: Negative for abdominal pain, nausea and vomiting.  Genitourinary: Positive for frequency. Negative for difficulty urinating, dyspareunia, flank pain, hematuria, pelvic pain and vaginal bleeding.      Objective:    BP 124/72   Pulse 80   Wt 192 lb (87.1 kg)   BMI 30.99 kg/m    Physical Exam  Constitutional: She appears well-developed and well-nourished.  Eyes: Conjunctivae are normal.  Cardiovascular: Normal rate,  regular rhythm, normal heart sounds and normal pulses.   Pulmonary/Chest: Effort normal and breath sounds normal. She has no wheezes. She has no rhonchi. She has no rales.  Abdominal: Soft. Normal appearance and bowel sounds are normal. She exhibits no distension, no fluid wave, no ascites and no mass. There is no tenderness. There is no rigidity, no rebound, no guarding and no CVA tenderness.  Neurological: She is alert.  Skin: Skin is warm and dry.  Psychiatric: She has a normal mood and affect. Her speech is normal and behavior is normal. Thought content normal.  Vitals reviewed.      Assessment & Plan:   Problem List Items Addressed This Visit      Other   Urinary, incontinence, stress female    Symptoms consistent with stress and urge incontinence. As kegels havent demonstrated much relief, we jointly decided on consult with urology. Concern for antispasmodics and anticholingeric side effects in context of Parkinson's.        Other Visit Diagnoses    Mixed incontinence    -  Primary   Relevant Orders   Ambulatory referral to Urology        I am having [name] maintain her calcium carbonate, (multivitamin, stress formula), multivitamin, fexofenadine, pramipexole, Alpha-Lipoic Acid, carbidopa-levodopa, traZODone, levothyroxine, losartan-hydrochlorothiazide, Lifitegrast, hydroxypropyl methylcellulose / hypromellose, Omega 3, and aspirin.   No orders of the defined types were placed in this encounter.   Return precautions given.   Risks, benefits, and alternatives of the medications and treatment plan prescribed today were discussed, and patient expressed understanding.   Education regarding symptom management and diagnosis given to patient on AVS.  Continue to follow with Christina Dove, MD for routine health maintenance.   Christina [name] and I agreed with plan.   Christina Plowman, FNP

## 2015-09-21 ENCOUNTER — Encounter: Payer: Self-pay | Admitting: Internal Medicine

## 2015-10-02 ENCOUNTER — Ambulatory Visit (INDEPENDENT_AMBULATORY_CARE_PROVIDER_SITE_OTHER): Payer: Medicare HMO | Admitting: Internal Medicine

## 2015-10-02 ENCOUNTER — Encounter: Payer: Self-pay | Admitting: Family

## 2015-10-02 ENCOUNTER — Encounter: Payer: Self-pay | Admitting: Internal Medicine

## 2015-10-02 VITALS — BP 124/70 | HR 82 | Ht 64.0 in | Wt 192.0 lb

## 2015-10-02 DIAGNOSIS — I447 Left bundle-branch block, unspecified: Secondary | ICD-10-CM

## 2015-10-02 DIAGNOSIS — Z950 Presence of cardiac pacemaker: Secondary | ICD-10-CM

## 2015-10-02 DIAGNOSIS — R001 Bradycardia, unspecified: Secondary | ICD-10-CM

## 2015-10-02 LAB — CUP PACEART INCLINIC DEVICE CHECK
Battery Voltage: 3.03 V
Brady Statistic AS VP Percent: 0 %
Brady Statistic AS VS Percent: 3.48 %
Date Time Interrogation Session: 20170815142154
Implantable Lead Implant Date: 20170515
Implantable Lead Model: 5076
Lead Channel Impedance Value: 380 Ohm
Lead Channel Impedance Value: 380 Ohm
Lead Channel Pacing Threshold Pulse Width: 1 ms
Lead Channel Sensing Intrinsic Amplitude: 2.375 mV
Lead Channel Setting Pacing Amplitude: 5 V
Lead Channel Setting Sensing Sensitivity: 2.8 mV
MDC IDC LEAD IMPLANT DT: 20170515
MDC IDC LEAD LOCATION: 753859
MDC IDC LEAD LOCATION: 753860
MDC IDC MSMT BATTERY REMAINING LONGEVITY: 109 mo
MDC IDC MSMT LEADCHNL RA IMPEDANCE VALUE: 437 Ohm
MDC IDC MSMT LEADCHNL RA PACING THRESHOLD AMPLITUDE: 0.5 V
MDC IDC MSMT LEADCHNL RA PACING THRESHOLD PULSEWIDTH: 0.4 ms
MDC IDC MSMT LEADCHNL RV IMPEDANCE VALUE: 475 Ohm
MDC IDC MSMT LEADCHNL RV PACING THRESHOLD AMPLITUDE: 2.75 V
MDC IDC MSMT LEADCHNL RV SENSING INTR AMPL: 10.625 mV
MDC IDC SET LEADCHNL RA PACING AMPLITUDE: 2 V
MDC IDC SET LEADCHNL RV PACING PULSEWIDTH: 1 ms
MDC IDC STAT BRADY AP VP PERCENT: 0.07 %
MDC IDC STAT BRADY AP VS PERCENT: 96.45 %
MDC IDC STAT BRADY RA PERCENT PACED: 96.51 %
MDC IDC STAT BRADY RV PERCENT PACED: 0.07 %

## 2015-10-02 NOTE — Patient Instructions (Addendum)
Medication Instructions: - Your physician recommends that you continue on your current medications as directed. Please refer to the Current Medication list given to you today.  Labwork: - none  Procedures/Testing: - none  Follow-Up: - Remote monitoring is used to monitor your Pacemaker of ICD from home. This monitoring reduces the number of office visits required to check your device to one time per year. It allows us to keep an eye on the functioning of your device to ensure it is working properly. You are scheduled for a device check from home on 01/01/16. You may send your transmission at any time that day. If you have a wireless device, the transmission will be sent automatically. After your physician reviews your transmission, you will receive a postcard with your next transmission date.  - Your physician wants you to follow-up in: 9 months with Dr. Graciela HusbandsKlein. You will receive a reminder letter in the mail two months in advance. If you don't receive a letter, please call our office to schedule the follow-up appointment.  Any Additional Special Instructions Will Be Listed Below (If Applicable).     If you need a refill on your cardiac medications before your next appointment, please call your pharmacy.

## 2015-10-02 NOTE — Progress Notes (Signed)
Patient Care Team: Allegra GranaMargaret G Arnett, FNP as PCP - General (Family Medicine)   HPI  Christina [name] is a 70 y.o. female Seen in follow-up for syncope associated with left bundle branch block. She underwent pacemaker implantation 5/17.  There had been some concern that her Parkinson medications were contributing to her sinus node dysfunction and/or her heart block. It was her decision given disc and body ablation of her tremors that she wanted to maintain her medications. Her tremors well-controlled  She is much much improved. Her husband cannot understand were all the energy came from   Records and Results Reviewed   Past Medical History:  Diagnosis Date  . Diverticula of intestine   . Essential hypertension   . Hypothyroidism    hypothyroidism  . Parkinson's disease (HCC)   . Sinus infection   . Vertigo     Past Surgical History:  Procedure Laterality Date  . EP IMPLANTABLE DEVICE N/A 07/02/2015   Procedure: Pacemaker Implant;  Surgeon: Duke SalviaSteven C Kyasia Steuck, MD;  [facility];  Service: Cardiovascular;  Laterality: N/A;  . GALLBLADDER SURGERY  2005  . THYROIDECTOMY  1976  . TUBAL LIGATION  1988  . VAGINAL DELIVERY      Current Outpatient Prescriptions  Medication Sig Dispense Refill  . Alpha-Lipoic Acid 200 MG CAPS Take 200 mg by mouth 3 (three) times daily.     Marland Kitchen. aspirin EC 81 MG EC tablet Take 1 tablet (81 mg total) by mouth daily. 30 tablet 2  . B Complex-C-Folic Acid (MULTIVITAMIN, STRESS FORMULA) tablet Take 1 tablet by mouth daily. Reported on 03/12/2015    . calcium carbonate (OS-CAL) 600 MG TABS Take 600 mg by mouth daily.      . carbidopa-levodopa (SINEMET IR) 25-100 MG tablet Take 2 tablets by mouth 3 (three) times daily.     . fexofenadine (ALLEGRA) 180 MG tablet Take 1 tablet (180 mg total) by mouth daily. (Patient taking differently: Take 180 mg by mouth daily as needed. ) 90 tablet 4  . hydroxypropyl methylcellulose / hypromellose  (ISOPTO TEARS / GONIOVISC) 2.5 % ophthalmic solution Place 1 drop into both eyes 2 (two) times daily as needed for dry eyes.    Marland Kitchen. levothyroxine (SYNTHROID, LEVOTHROID) 125 MCG tablet Take 1 tablet (125 mcg total) by mouth daily. 90 tablet 3  . Lifitegrast (XIIDRA) 5 % SOLN Place 1 drop into both eyes 2 (two) times daily.    Marland Kitchen. losartan-hydrochlorothiazide (HYZAAR) 50-12.5 MG tablet TAKE ONE TABLET BY MOUTH ONCE DAILY 90 tablet 3  . Multiple Vitamin (MULTIVITAMIN) tablet Take 1 tablet by mouth daily. Reported on 03/12/2015    . Omega 3 1200 MG CAPS Take 1 capsule by mouth daily.    . pramipexole (MIRAPEX) 0.25 MG tablet Take 0.125-0.25 mg by mouth daily. Pt. Takes 0.5 tablet (0.125mg ) in the morning, 0.5 tablet (0.125mg ) at lunch, then takes a whole tablet (0.25mg ) at bedtime.    . traZODone (DESYREL) 100 MG tablet Take 100 mg by mouth at bedtime.      No current facility-administered medications for this visit.     Allergies  Allergen Reactions  . Lisinopril-Hydrochlorothiazide Other (See Comments)    Post nasal drainage  . Penicillins Rash      Review of Systems negative except from HPI and PMH  Physical Exam BP 124/70 (BP Location: Left Arm, Patient Position: Sitting, Cuff Size: Normal)   Pulse 82   Ht 5\' 4"  (1.626 m)  Wt 192 lb (87.1 kg)   BMI 32.96 kg/m  Well developed and well nourished in no acute distress HENT normal E scleral and icterus clear Neck Supple JVP flat; carotids brisk and full Clear to ausculation Device pocket well healed; without hematoma or erythema.  There is no tethering Regular rate and rhythm, no murmurs gallops or rub Soft with active bowel sounds No clubbing cyanosis  Edema Alert and oriented, grossly normal motor and sensory function Skin Warm and Dry  ECG demonstrates atrial pacing at 80 Intervals 36/14/41  Assessment and  Plan  Syncope  Left bundle branch block  First-degree AV block  Chronotropic incompetence/sinus node  dysfunction  Pacemaker-[employer]  Parkinson's   The impact of sinus node replacement with pacing as a surprise to me. She has some intermittent ventricular pacing consistent with our concerns of intermittent complete heart block. Furthermore,  her first degree AV block his profound.  I suspect she will come to complete heart block  Device reprogrammed to maximize longevity

## 2015-10-05 ENCOUNTER — Ambulatory Visit: Payer: Medicare HMO | Admitting: Internal Medicine

## 2015-10-29 ENCOUNTER — Encounter: Payer: Self-pay | Admitting: Internal Medicine

## 2015-12-25 ENCOUNTER — Telehealth: Payer: Self-pay | Admitting: Family

## 2015-12-25 NOTE — Telephone Encounter (Signed)
Patient has been notified

## 2015-12-25 NOTE — Telephone Encounter (Signed)
Pt is scheduled to see Christina [name] tomorrow @ 10 for laryngitis & sinus drainage. She wants to know if there is anything that can be called in instead of her coming in to be seen. She said with this weather she hates to come out and make her symptoms worse.

## 2015-12-25 NOTE — Telephone Encounter (Signed)
Needs to be evaluated.

## 2015-12-25 NOTE — Telephone Encounter (Signed)
Please advise 

## 2015-12-26 ENCOUNTER — Encounter: Payer: Self-pay | Admitting: Family

## 2015-12-26 ENCOUNTER — Ambulatory Visit (INDEPENDENT_AMBULATORY_CARE_PROVIDER_SITE_OTHER): Payer: Medicare HMO | Admitting: Family

## 2015-12-26 VITALS — BP 118/64 | HR 65 | Temp 98.6°F | Resp 16 | Wt 192.8 lb

## 2015-12-26 DIAGNOSIS — J029 Acute pharyngitis, unspecified: Secondary | ICD-10-CM

## 2015-12-26 MED ORDER — AZITHROMYCIN 250 MG PO TABS: ORAL_TABLET | ORAL | 0 refills | Status: DC

## 2015-12-26 MED ORDER — DM-GUAIFENESIN ER 30-600 MG PO TB12: 1.0000 | ORAL_TABLET | Freq: Two times a day (BID) | ORAL | 0 refills | Status: AC | PRN

## 2015-12-26 NOTE — Patient Instructions (Signed)
I suspect that your infection is viral in nature.  As discussed, I advise that you wait to fill the antibiotic after 1-2 days of symptom management to see if your symptoms improve. If you do not show improvement, you may take the antibiotic as prescribed.  Increase intake of clear fluids. Congestion is best treated by hydration, when mucus is wetter, it is thinner, less sticky, and easier to expel from the body, either through coughing up drainage, or by blowing your nose.   Get plenty of rest.   Use saline nasal drops and blow your nose frequently. Run a humidifier at night and elevate the head of the bed. Vicks Vapor rub will help with congestion and cough. Steam showers and sinus massage for congestion.   Use Acetaminophen or Ibuprofen as needed for fever or pain. Avoid second hand smoke. Even the smallest exposure will worsen symptoms.   Over the counter medications you can try include Delsym for cough, a decongestant for congestion, and Mucinex or Robitussin as an expectorant. Be sure to just get the plain Mucinex or Robitussin that just has one medication (Guaifenesen). We don't recommend the combination products. Note, be sure to drink two glasses of water with each dose of Mucinex as the medication will not work well without adequate hydration.   You can also try a teaspoon of honey to see if this will help reduce cough. Throat lozenges can sometimes be beneficial as well.    This illness will typically last 7 - 10 days.   Please follow up with our clinic if you develop a fever greater than 101 F, symptoms worsen, or do not resolve in the next week.     

## 2015-12-26 NOTE — Progress Notes (Signed)
Subjective:    Patient ID: Christina SamsNancy Ruth Barillas, female    DOB: [birth year], 70 y.o.   MRN: [id number]  CC: Christina Samsancy Ruth Racine is a 70 y.o. female who presents today for an acute visit.    HPI: Patient here for acute visit chief co of losing voice for past 6 days, worsening. Also endorses cough with green phlegm worse at night. She has tried Careers adviserallegra and mucinex without resovle. No sob, wheezng, fever, chills.      HISTORY:  Past Medical History:  Diagnosis Date  . Diverticula of intestine   . Essential hypertension   . Hypothyroidism    hypothyroidism  . Parkinson's disease (HCC)   . Sinus infection   . Vertigo    Past Surgical History:  Procedure Laterality Date  . EP IMPLANTABLE DEVICE N/A 07/02/2015   Procedure: Pacemaker Implant;  Surgeon: Duke SalviaSteven C Klein, MD;  [facility];  Service: Cardiovascular;  Laterality: N/A;  . GALLBLADDER SURGERY  2005  . THYROIDECTOMY  1976  . TUBAL LIGATION  1988  . VAGINAL DELIVERY     Family History  Problem Relation Age of Onset  . Lung cancer Mother   . Diabetes Mother   . Hypertension Mother   . Cancer Mother     lung  . Lung cancer Father   . Cancer Father     lung  . Bradycardia Father   . Diabetes Maternal Grandmother     Allergies: Lisinopril-hydrochlorothiazide and Penicillins Current Outpatient Prescriptions on File Prior to Visit  Medication Sig Dispense Refill  . Alpha-Lipoic Acid 200 MG CAPS Take 200 mg by mouth 3 (three) times daily.     Marland Kitchen. aspirin EC 81 MG EC tablet Take 1 tablet (81 mg total) by mouth daily. 30 tablet 2  . calcium carbonate (OS-CAL) 600 MG TABS Take 600 mg by mouth daily.      . carbidopa-levodopa (SINEMET IR) 25-100 MG tablet Take 2 tablets by mouth 3 (three) times daily.     . hydroxypropyl methylcellulose / hypromellose (ISOPTO TEARS / GONIOVISC) 2.5 % ophthalmic solution Place 1 drop into both eyes 2 (two) times daily as needed for dry eyes.    Marland Kitchen. levothyroxine (SYNTHROID,  LEVOTHROID) 125 MCG tablet Take 1 tablet (125 mcg total) by mouth daily. 90 tablet 3  . losartan-hydrochlorothiazide (HYZAAR) 50-12.5 MG tablet TAKE ONE TABLET BY MOUTH ONCE DAILY 90 tablet 3  . Multiple Vitamin (MULTIVITAMIN) tablet Take 1 tablet by mouth daily. Reported on 03/12/2015    . Omega 3 1200 MG CAPS Take 1 capsule by mouth daily.    . B Complex-C-Folic Acid (MULTIVITAMIN, STRESS FORMULA) tablet Take 1 tablet by mouth daily. Reported on 03/12/2015    . fexofenadine (ALLEGRA) 180 MG tablet Take 1 tablet (180 mg total) by mouth daily. (Patient taking differently: Take 180 mg by mouth daily as needed. ) 90 tablet 4  . Lifitegrast (XIIDRA) 5 % SOLN Place 1 drop into both eyes 2 (two) times daily.    . pramipexole (MIRAPEX) 0.25 MG tablet Take 0.125-0.25 mg by mouth daily. Pt. Takes 0.5 tablet (0.125mg ) in the morning, 0.5 tablet (0.125mg ) at lunch, then takes a whole tablet (0.25mg ) at bedtime.    . traZODone (DESYREL) 100 MG tablet Take 100 mg by mouth at bedtime.      No current facility-administered medications on file prior to visit.     Social History  Substance Use Topics  . Smoking status: Never Smoker  . Smokeless  tobacco: Never Used  . Alcohol use 0.0 oz/week     Comment: occasional/rare glass of wine.    Review of Systems  Constitutional: Negative for chills and fever.  HENT: Positive for voice change. Negative for ear discharge, ear pain, sinus pressure and sore throat.   Respiratory: Positive for cough. Negative for shortness of breath and wheezing.   Cardiovascular: Negative for chest pain and palpitations.  Gastrointestinal: Negative for nausea and vomiting.  Neurological: Negative for headaches.      Objective:    BP 118/64 (BP Location: Left Arm, Patient Position: Sitting, Cuff Size: Normal)   Pulse 65   Temp 98.6 F (37 C) (Oral)   Resp 16   Wt 192 lb 12 oz (87.4 kg)   SpO2 96%   BMI 33.09 kg/m    Physical Exam  Constitutional: She appears  well-developed and well-nourished.  HENT:  Head: Normocephalic and atraumatic.  Right Ear: Hearing, tympanic membrane, external ear and ear canal normal. No drainage, swelling or tenderness. No foreign bodies. Tympanic membrane is not erythematous and not bulging. No middle ear effusion. No decreased hearing is noted.  Left Ear: Hearing, tympanic membrane, external ear and ear canal normal. No drainage, swelling or tenderness. No foreign bodies. Tympanic membrane is not erythematous and not bulging.  No middle ear effusion. No decreased hearing is noted.  Nose: Nose normal. No rhinorrhea. Right sinus exhibits no maxillary sinus tenderness and no frontal sinus tenderness. Left sinus exhibits no maxillary sinus tenderness and no frontal sinus tenderness.  Mouth/Throat: Uvula is midline, oropharynx is clear and moist and mucous membranes are normal. No oropharyngeal exudate, posterior oropharyngeal edema, posterior oropharyngeal erythema or tonsillar abscesses.  Eyes: Conjunctivae are normal.  Cardiovascular: Regular rhythm, normal heart sounds and normal pulses.   Pulmonary/Chest: Effort normal and breath sounds normal. She has no wheezes. She has no rhonchi. She has no rales.  Lymphadenopathy:       Head (right side): No submental, no submandibular, no tonsillar, no preauricular, no posterior auricular and no occipital adenopathy present.       Head (left side): No submental, no submandibular, no tonsillar, no preauricular, no posterior auricular and no occipital adenopathy present.    She has no cervical adenopathy.  Neurological: She is alert.  Skin: Skin is warm and dry.  Psychiatric: She has a normal mood and affect. Her speech is normal and behavior is normal. Thought content normal.  Vitals reviewed.      Assessment & Plan:  1. Viral pharyngitis Afebrile. No adventitious lung sounds. Patient and I agreed to treat conservatively and delay in antibiotics. Return precautions given.    -  dextromethorphan-guaiFENesin (MUCINEX DM) 30-600 MG 12hr tablet; Take 1 tablet by mouth 2 (two) times daily as needed for cough (taking daily).  Dispense: 30 tablet; Refill: 0 - azithromycin (ZITHROMAX) 250 MG tablet; Tale 500 mg PO on day 1, then 250 mg PO q24h x 4 days.  Dispense: 6 tablet; Refill: 0     I am having Ms. Lata maintain her calcium carbonate, (multivitamin, stress formula), multivitamin, fexofenadine, pramipexole, Alpha-Lipoic Acid, carbidopa-levodopa, traZODone, levothyroxine, losartan-hydrochlorothiazide, Lifitegrast, hydroxypropyl methylcellulose / hypromellose, Omega 3, aspirin, solifenacin, and dextromethorphan-guaiFENesin.   Meds ordered this encounter  Medications  . solifenacin (VESICARE) 5 MG tablet    Sig: Take 5 mg by mouth daily.  Marland Kitchen. dextromethorphan-guaiFENesin (MUCINEX DM) 30-600 MG 12hr tablet    Sig: Take 1 tablet by mouth 2 (two) times daily as needed for cough (taking daily).  Return precautions given.   Risks, benefits, and alternatives of the medications and treatment plan prescribed today were discussed, and patient expressed understanding.   Education regarding symptom management and diagnosis given to patient on AVS.  Continue to follow with Rennie Plowman, FNP for routine health maintenance.   Emelia Loron Shadduck and I agreed with plan.   Rennie Plowman, FNP

## 2016-01-01 ENCOUNTER — Ambulatory Visit (INDEPENDENT_AMBULATORY_CARE_PROVIDER_SITE_OTHER): Payer: Medicare HMO | Admitting: *Deleted

## 2016-01-01 DIAGNOSIS — R001 Bradycardia, unspecified: Secondary | ICD-10-CM | POA: Diagnosis not present

## 2016-01-01 NOTE — Progress Notes (Signed)
Remote pacemaker transmission.   

## 2016-01-11 LAB — CUP PACEART REMOTE DEVICE CHECK
Battery Remaining Longevity: 33 mo
Battery Voltage: 2.98 V
Brady Statistic AP VS Percent: 26.44 %
Implantable Lead Implant Date: 20170515
Implantable Lead Location: 753859
Implantable Lead Model: 5076
Implantable Lead Model: 5076
Implantable Pulse Generator Implant Date: 20170515
Lead Channel Impedance Value: 380 Ohm
Lead Channel Impedance Value: 418 Ohm
Lead Channel Impedance Value: 456 Ohm
Lead Channel Sensing Intrinsic Amplitude: 8.625 mV
Lead Channel Sensing Intrinsic Amplitude: 8.625 mV
Lead Channel Setting Pacing Amplitude: 2 V
Lead Channel Setting Pacing Pulse Width: 1 ms
MDC IDC LEAD IMPLANT DT: 20170515
MDC IDC LEAD LOCATION: 753860
MDC IDC MSMT LEADCHNL RA PACING THRESHOLD AMPLITUDE: 0.5 V
MDC IDC MSMT LEADCHNL RA PACING THRESHOLD PULSEWIDTH: 0.4 ms
MDC IDC MSMT LEADCHNL RA SENSING INTR AMPL: 3 mV
MDC IDC MSMT LEADCHNL RA SENSING INTR AMPL: 3 mV
MDC IDC MSMT LEADCHNL RV IMPEDANCE VALUE: 456 Ohm
MDC IDC MSMT LEADCHNL RV PACING THRESHOLD AMPLITUDE: 2.5 V
MDC IDC MSMT LEADCHNL RV PACING THRESHOLD PULSEWIDTH: 0.4 ms
MDC IDC SESS DTM: 20171114152910
MDC IDC SET LEADCHNL RV PACING AMPLITUDE: 5 V
MDC IDC SET LEADCHNL RV SENSING SENSITIVITY: 2.8 mV
MDC IDC STAT BRADY AP VP PERCENT: 73.21 %
MDC IDC STAT BRADY AS VP PERCENT: 0.22 %
MDC IDC STAT BRADY AS VS PERCENT: 0.13 %
MDC IDC STAT BRADY RA PERCENT PACED: 99.65 %
MDC IDC STAT BRADY RV PERCENT PACED: 73.43 %

## 2016-03-20 NOTE — Progress Notes (Signed)
Subjective:    Patient ID: Christina [name], female    DOB: [birth year], 71 y.o.   MRN: [id number]  CC: Christina [name] is a 71 y.o. female who presents today for physical exam.    HPI: ED last night after fall from 'vertigo' and hit back of head kitchen floor, HA improving today. Normal CT head and cervical spine last night. Nauseated at the time. No diaphoresis, CP, SOB, palpiations.  Started on meclizine. Vertigo better today without sudden movements. No hearing changes, tinnitus, congestion. Has seen ENT in the past and done Epley's maneuvers with relief.   HTN- Pacemaker; compliant with meds; 134/72 at home. Denies exertional chest pain or pressure, numbness or tingling radiating to left arm or jaw, palpitations, dizziness, frequent headaches, changes in vision, or shortness of breath.   Dr. Alberteen Spindle cardiologist         Colorectal Cancer Screening: UTD , 2010, [place], normal per patient. Unable to see report. Suspects 10 years.  Breast Cancer Screening: Mammogram due Cervical Cancer Screening: done 3 years ago at 71 years old. Normal. NO h/o GYN cancer. No bleeding.  Bone Health screening/DEXA for 65+: Due Lung Cancer Screening: Doesn't have 30 year pack year history and age > 55 years.  Immunizations       Tetanus - UTD        Pneumococcal - UTD Labs: Screening labs today. Exercise: plans to start boxing Alcohol use: occasional Smoking/tobacco use: Nonsmoker.  Regular dental exams: UTD Wears seat belt: Yes. Skin: no new lesions  HISTORY:  Past Medical History:  Diagnosis Date  . Diverticula of intestine   . Essential hypertension   . Hypothyroidism    hypothyroidism  . Parkinson's disease (HCC)   . Sinus infection   . Vertigo     Past Surgical History:  Procedure Laterality Date  . EP IMPLANTABLE DEVICE N/A 07/02/2015   Procedure: Pacemaker Implant;  Surgeon: Duke Salvia, MD;  Location: [facility] INVASIVE CV LAB;  Service: Cardiovascular;  Laterality: N/A;    . GALLBLADDER SURGERY  2005  . THYROIDECTOMY  1976  . TUBAL LIGATION  1988  . VAGINAL DELIVERY     Family History  Problem Relation Age of Onset  . Lung cancer Mother   . Diabetes Mother   . Hypertension Mother   . Cancer Mother     lung  . Lung cancer Father   . Cancer Father     lung  . Bradycardia Father   . Diabetes Maternal Grandmother       ALLERGIES: Lisinopril-hydrochlorothiazide and Penicillins  Current Outpatient Prescriptions on File Prior to Visit  Medication Sig Dispense Refill  . Alpha-Lipoic Acid 200 MG CAPS Take 200 mg by mouth 3 (three) times daily.     Marland Kitchen aspirin EC 81 MG EC tablet Take 1 tablet (81 mg total) by mouth daily. 30 tablet 2  . calcium carbonate (OS-CAL) 600 MG TABS Take 600 mg by mouth daily.      Marland Kitchen dextromethorphan-guaiFENesin (MUCINEX DM) 30-600 MG 12hr tablet Take 1 tablet by mouth 2 (two) times daily as needed for cough (taking daily). 30 tablet 0  . levothyroxine (SYNTHROID, LEVOTHROID) 125 MCG tablet Take 1 tablet (125 mcg total) by mouth daily. 90 tablet 3  . Lifitegrast (XIIDRA) 5 % SOLN Place 1 drop into both eyes 2 (two) times daily.    Marland Kitchen losartan-hydrochlorothiazide (HYZAAR) 50-12.5 MG tablet TAKE ONE TABLET BY MOUTH ONCE DAILY 90 tablet 3  . Multiple Vitamin (MULTIVITAMIN)  tablet Take 1 tablet by mouth daily. Reported on 03/12/2015    . Omega 3 1200 MG CAPS Take 1 capsule by mouth daily.    . solifenacin (VESICARE) 5 MG tablet Take 5 mg by mouth daily.    . carbidopa-levodopa (SINEMET IR) 25-100 MG tablet Take 2 tablets by mouth 3 (three) times daily.     . fexofenadine (ALLEGRA) 180 MG tablet Take 1 tablet (180 mg total) by mouth daily. (Patient taking differently: Take 180 mg by mouth daily as needed. ) 90 tablet 4  . pramipexole (MIRAPEX) 0.25 MG tablet Take 0.125-0.25 mg by mouth daily. Pt. Takes 0.5 tablet (0.125mg ) in the morning, 0.5 tablet (0.125mg ) at lunch, then takes a whole tablet (0.25mg ) at bedtime.    . traZODone (DESYREL)  100 MG tablet Take 100 mg by mouth at bedtime.      No current facility-administered medications on file prior to visit.     Social History  Substance Use Topics  . Smoking status: Never Smoker  . Smokeless tobacco: Never Used  . Alcohol use 0.0 oz/week     Comment: occasional/rare glass of wine.    Review of Systems  Constitutional: Negative for chills, fever and unexpected weight change.  HENT: Negative for congestion.   Respiratory: Negative for cough.   Cardiovascular: Negative for chest pain, palpitations and leg swelling.  Gastrointestinal: Negative for nausea and vomiting.  Musculoskeletal: Negative for arthralgias and myalgias.  Skin: Negative for rash.  Neurological: Negative for headaches.  Hematological: Negative for adenopathy.  Psychiatric/Behavioral: Negative for confusion.      Objective:    BP 126/68   Pulse 84   Temp 98.1 F (36.7 C) (Oral)   Ht 5\' 5"  (1.651 m)   Wt 192 lb 9.6 oz (87.4 kg)   SpO2 97%   BMI 32.05 kg/m   BP Readings from Last 3 Encounters:  03/24/16 126/68  03/24/16 (!) 161/72  12/26/15 118/64   Wt Readings from Last 3 Encounters:  03/24/16 192 lb 9.6 oz (87.4 kg)  03/23/16 190 lb (86.2 kg)  12/26/15 192 lb 12 oz (87.4 kg)    Physical Exam  Constitutional: She appears well-developed and well-nourished.  HENT:  Head: Head is without raccoon's eyes, without Battle's sign, without abrasion and without contusion.  Mouth/Throat: Uvula is midline, oropharynx is clear and moist and mucous membranes are normal.  Hematoma noted on diagram. No bleeding.   Eyes: Conjunctivae and EOM are normal. Pupils are equal, round, and reactive to light.  Fundus normal bilaterally.   Neck: No thyroid mass and no thyromegaly present.  Cardiovascular: Normal rate, regular rhythm, normal heart sounds and normal pulses.   Pulmonary/Chest: Effort normal and breath sounds normal. She has no wheezes. She has no rhonchi. She has no rales. Right breast  exhibits no inverted nipple, no mass, no nipple discharge, no skin change and no tenderness. Left breast exhibits no inverted nipple, no mass, no nipple discharge, no skin change and no tenderness. Breasts are symmetrical.  CBE performed.   Lymphadenopathy:       Head (right side): No submental, no submandibular, no tonsillar, no preauricular, no posterior auricular and no occipital adenopathy present.       Head (left side): No submental, no submandibular, no tonsillar, no preauricular, no posterior auricular and no occipital adenopathy present.    She has no cervical adenopathy.       Right cervical: No superficial cervical, no deep cervical and no posterior cervical adenopathy present.  Left cervical: No superficial cervical, no deep cervical and no posterior cervical adenopathy present.    She has no axillary adenopathy.  Neurological: She is alert. She has normal strength. No cranial nerve deficit or sensory deficit. She displays a negative Romberg sign.  Reflex Scores:      Bicep reflexes are 2+ on the right side and 2+ on the left side.      Patellar reflexes are 2+ on the right side and 2+ on the left side. Grip equal and strong bilateral upper extremities. Gait strong and steady. Able to perform rapid alternating movement without difficulty.   Skin: Skin is warm and dry.  Psychiatric: She has a normal mood and affect. Her speech is normal and behavior is normal. Thought content normal.  Vitals reviewed.      Assessment & Plan:   Problem List Items Addressed This Visit      Cardiovascular and Mediastinum   Essential hypertension    Stable. Continue current regimen.       LBBB (left bundle branch block)    Pacemaker; follows with Dr. Alberteen Spindle.         Other   Vertigo    Reassured by normal neurologic exam and work up from ED last night. HA improving. On meclizine. Will follow      Routine physical examination - Primary    UTD colonoscopy. Ordered mammogram and patient  understands to schedule. Pap smear done at 71 years of age which was normal per patient. She has no history of gynecologic cancer or any complaints today. She declines further Pap smears or pelvic exam today based on age and preference. Screening labs ordered.       Relevant Orders   Hemoglobin A1c   Lipid panel   TSH   VITAMIN D 25 Hydroxy (Vit-D Deficiency, Fractures)   MM DIGITAL SCREENING BILATERAL   DG Bone Density       I have discontinued Ms. Todt's (multivitamin, stress formula), hydroxypropyl methylcellulose / hypromellose, and azithromycin. I am also having her maintain her calcium carbonate, multivitamin, fexofenadine, pramipexole, Alpha-Lipoic Acid, carbidopa-levodopa, traZODone, levothyroxine, losartan-hydrochlorothiazide, Lifitegrast, Omega 3, aspirin, solifenacin, and dextromethorphan-guaiFENesin.   No orders of the defined types were placed in this encounter.   Return precautions given.   Risks, benefits, and alternatives of the medications and treatment plan prescribed today were discussed, and patient expressed understanding.   Education regarding symptom management and diagnosis given to patient on AVS.   Continue to follow with Rennie Plowman, FNP for routine health maintenance.   Emelia Loron Helle and I agreed with plan.   Rennie Plowman, FNP

## 2016-03-23 ENCOUNTER — Encounter: Payer: Self-pay | Admitting: Emergency Medicine

## 2016-03-23 ENCOUNTER — Emergency Department
Admission: EM | Admit: 2016-03-23 | Discharge: 2016-03-24 | Disposition: A | Discharge status: Home / Self Care | Payer: Medicare HMO | Attending: Emergency Medicine | Admitting: Emergency Medicine

## 2016-03-23 ENCOUNTER — Emergency Department: Payer: Medicare HMO

## 2016-03-23 DIAGNOSIS — I10 Essential (primary) hypertension: Secondary | ICD-10-CM | POA: Diagnosis not present

## 2016-03-23 DIAGNOSIS — Y929 Unspecified place or not applicable: Secondary | ICD-10-CM | POA: Insufficient documentation

## 2016-03-23 DIAGNOSIS — E039 Hypothyroidism, unspecified: Secondary | ICD-10-CM | POA: Insufficient documentation

## 2016-03-23 DIAGNOSIS — S0990XA Unspecified injury of head, initial encounter: Secondary | ICD-10-CM | POA: Diagnosis present

## 2016-03-23 DIAGNOSIS — R55 Syncope and collapse: Secondary | ICD-10-CM | POA: Insufficient documentation

## 2016-03-23 DIAGNOSIS — Y999 Unspecified external cause status: Secondary | ICD-10-CM | POA: Insufficient documentation

## 2016-03-23 DIAGNOSIS — W1809XA Striking against other object with subsequent fall, initial encounter: Secondary | ICD-10-CM | POA: Insufficient documentation

## 2016-03-23 DIAGNOSIS — Y939 Activity, unspecified: Secondary | ICD-10-CM | POA: Diagnosis not present

## 2016-03-23 DIAGNOSIS — W19XXXA Unspecified fall, initial encounter: Secondary | ICD-10-CM

## 2016-03-23 LAB — CBC WITH DIFFERENTIAL/PLATELET
Basophils Absolute: 0.1 10*3/uL (ref 0–0.1)
Basophils Relative: 1 %
EOS PCT: 2 %
Eosinophils Absolute: 0.1 10*3/uL (ref 0–0.7)
HEMATOCRIT: 44.3 % (ref 35.0–47.0)
Hemoglobin: 14.5 g/dL (ref 12.0–16.0)
LYMPHS PCT: 21 %
Lymphs Abs: 1.6 10*3/uL (ref 1.0–3.6)
MCH: 30.7 pg (ref 26.0–34.0)
MCHC: 32.8 g/dL (ref 32.0–36.0)
MCV: 93.5 fL (ref 80.0–100.0)
MONO ABS: 0.6 10*3/uL (ref 0.2–0.9)
Monocytes Relative: 8 %
NEUTROS ABS: 5.2 10*3/uL (ref 1.4–6.5)
Neutrophils Relative %: 68 %
Platelets: 175 10*3/uL (ref 150–440)
RBC: 4.74 MIL/uL (ref 3.80–5.20)
RDW: 13.8 % (ref 11.5–14.5)
WBC: 7.6 10*3/uL (ref 3.6–11.0)

## 2016-03-23 LAB — COMPREHENSIVE METABOLIC PANEL
ALT: 6 U/L — AB (ref 14–54)
AST: 26 U/L (ref 15–41)
Albumin: 4.1 g/dL (ref 3.5–5.0)
Alkaline Phosphatase: 80 U/L (ref 38–126)
Anion gap: 8 (ref 5–15)
BILIRUBIN TOTAL: 0.4 mg/dL (ref 0.3–1.2)
BUN: 22 mg/dL — AB (ref 6–20)
CHLORIDE: 101 mmol/L (ref 101–111)
CO2: 28 mmol/L (ref 22–32)
CREATININE: 0.86 mg/dL (ref 0.44–1.00)
Calcium: 9.5 mg/dL (ref 8.9–10.3)
GFR calc Af Amer: >60 mL/min (ref >60)
GFR calc non Af Amer: >60 mL/min (ref >60)
GLUCOSE: 145 mg/dL — AB (ref 65–99)
POTASSIUM: 3.6 mmol/L (ref 3.5–5.1)
Sodium: 137 mmol/L (ref 135–145)
TOTAL PROTEIN: 7.2 g/dL (ref 6.5–8.1)

## 2016-03-23 LAB — TROPONIN I
Troponin I: <0.03 ng/mL (ref <0.03)
Troponin I: <0.03 ng/mL (ref <0.03)

## 2016-03-23 MED ORDER — ONDANSETRON HCL 4 MG/2ML IJ SOLN
4.0000 mg | Freq: Once | INTRAMUSCULAR | Status: AC
  Administered 2016-03-23: 4 mg via INTRAVENOUS
  Filled 2016-03-23: qty 2

## 2016-03-23 NOTE — ED Triage Notes (Signed)
Patient from home via [facility]. Reports falling this evening and hitting her head on the kitchen floor. Husband reports brief LOC. Patient denies CP, SOB. Patient is alert and oriented x4. Reports taking 81mg  aspirin daily. Patient reports 2 episodes of vomiting after fall.

## 2016-03-23 NOTE — Discharge Instructions (Signed)
Please use Tylenol for pain. Please return for any worsening headache fever nausea vomiting or confusion change in the size of the pupils or any other problems. Please follow-up with your regular doctor this coming week. Please also return if you have any other problems.

## 2016-03-23 NOTE — ED Provider Notes (Signed)
[facility] Emergency Department Provider Note   ____________________________________________   First MD Initiated Contact with Patient 03/23/16 1943     (approximate)  I have reviewed the triage vital signs and the nursing notes.   HISTORY  Chief Complaint Loss of Consciousness    HPI Hazel Samsancy Ruth Widmayer is a 71 y.o. female patient reports she was standing and felt a little bit of dizziness fell down and hit her head and passed out. Patient remembers falling and hitting her head. Husband reports she was out briefly and came to and was awake alert and oriented immediately. She complains of a headache and has a large lump on the right occipital area of her head. She's had vertigo before but usually with vertigo she is more dizzy and vomiting more. Was nauseated initially but is not little longer nauseated or dizzy.   Past Medical History:  Diagnosis Date  . Diverticula of intestine   . Essential hypertension   . Hypothyroidism    hypothyroidism  . Parkinson's disease (HCC)   . Sinus infection   . Vertigo     Patient Active Problem List   Diagnosis Date Noted  . Pacemaker 10/02/2015  . LBBB (left bundle branch block) 10/02/2015  . Sinus arrest 07/02/2015  . Syncope 06/25/2015  . Bradycardia 06/25/2015  . Syncope and collapse   . Parkinson's disease (HCC)   . Essential hypertension   . Urinary, incontinence, stress female 03/12/2015  . Edema 08/02/2014  . Bilateral leg numbness 05/15/2014  . Left sciatic nerve pain 04/13/2014  . Diverticulitis of colon 01/16/2014  . Medicare annual wellness visit, subsequent 07/08/2013  . Insomnia 01/31/2013  . PD (Parkinson's disease) (HCC) 01/05/2013  . Screening for breast cancer 12/31/2012  . Anemia 12/31/2012  . Hypothyroidism 07/02/2011  . Hypertension 07/02/2011    Past Surgical History:  Procedure Laterality Date  . EP IMPLANTABLE DEVICE N/A 07/02/2015   Procedure: Pacemaker Implant;  Surgeon:  Duke SalviaSteven C Klein, MD;  [facility];  Service: Cardiovascular;  Laterality: N/A;  . GALLBLADDER SURGERY  2005  . THYROIDECTOMY  1976  . TUBAL LIGATION  1988  . VAGINAL DELIVERY      Prior to Admission medications   Medication Sig Start Date End Date Taking? Authorizing Provider  Alpha-Lipoic Acid 200 MG CAPS Take 200 mg by mouth 3 (three) times daily.     Historical Provider, MD  aspirin EC 81 MG EC tablet Take 1 tablet (81 mg total) by mouth daily. 06/26/15   Shaune PollackQing Chen, MD  azithromycin (ZITHROMAX) 250 MG tablet Tale 500 mg PO on day 1, then 250 mg PO q24h x 4 days. 12/26/15   Allegra GranaMargaret G Arnett, FNP  B Complex-C-Folic Acid (MULTIVITAMIN, STRESS FORMULA) tablet Take 1 tablet by mouth daily. Reported on 03/12/2015    Historical Provider, MD  calcium carbonate (OS-CAL) 600 MG TABS Take 600 mg by mouth daily.      Historical Provider, MD  carbidopa-levodopa (SINEMET IR) 25-100 MG tablet Take 2 tablets by mouth 3 (three) times daily.  12/28/14 12/28/15  Historical Provider, MD  dextromethorphan-guaiFENesin (MUCINEX DM) 30-600 MG 12hr tablet Take 1 tablet by mouth 2 (two) times daily as needed for cough (taking daily). 12/26/15   Allegra GranaMargaret G Arnett, FNP  fexofenadine (ALLEGRA) 180 MG tablet Take 1 tablet (180 mg total) by mouth daily. Patient taking differently: Take 180 mg by mouth daily as needed.  06/24/12 10/02/15  Shelia MediaJennifer A Walker, MD  hydroxypropyl methylcellulose /  hypromellose (ISOPTO TEARS / GONIOVISC) 2.5 % ophthalmic solution Place 1 drop into both eyes 2 (two) times daily as needed for dry eyes.    Historical Provider, MD  levothyroxine (SYNTHROID, LEVOTHROID) 125 MCG tablet Take 1 tablet (125 mcg total) by mouth daily. 03/12/15   Shelia Media, MD  Lifitegrast Benay Spice) 5 % SOLN Place 1 drop into both eyes 2 (two) times daily.    Historical Provider, MD  losartan-hydrochlorothiazide (HYZAAR) 50-12.5 MG tablet TAKE ONE TABLET BY MOUTH ONCE DAILY 06/04/15   Shelia Media, MD    Multiple Vitamin (MULTIVITAMIN) tablet Take 1 tablet by mouth daily. Reported on 03/12/2015    Historical Provider, MD  Omega 3 1200 MG CAPS Take 1 capsule by mouth daily.    Historical Provider, MD  pramipexole (MIRAPEX) 0.25 MG tablet Take 0.125-0.25 mg by mouth daily. Pt. Takes 0.5 tablet (0.125mg ) in the morning, 0.5 tablet (0.125mg ) at lunch, then takes a whole tablet (0.25mg ) at bedtime. 02/16/14 10/02/15  Historical Provider, MD  solifenacin (VESICARE) 5 MG tablet Take 5 mg by mouth daily.    Historical Provider, MD  traZODone (DESYREL) 100 MG tablet Take 100 mg by mouth at bedtime.  12/28/14 12/23/15  Historical Provider, MD    Allergies Lisinopril-hydrochlorothiazide and Penicillins  Family History  Problem Relation Age of Onset  . Lung cancer Mother   . Diabetes Mother   . Hypertension Mother   . Cancer Mother     lung  . Lung cancer Father   . Cancer Father     lung  . Bradycardia Father   . Diabetes Maternal Grandmother     Social History Social History  Substance Use Topics  . Smoking status: Never Smoker  . Smokeless tobacco: Never Used  . Alcohol use 0.0 oz/week     Comment: occasional/rare glass of wine.    Review of Systems Constitutional: No fever/chills Eyes: No visual changes. ENT: No sore throat. Cardiovascular: Denies chest pain. Respiratory: Denies shortness of breath. Gastrointestinal: No abdominal pain.  No nausea, no vomiting.  No diarrhea.  No constipation. Genitourinary: Negative for dysuria. Musculoskeletal: Negative for back pain. Skin: Negative for rash. Neurological: Negative for headaches, focal weakness or numbness.  10-point ROS otherwise negative.  ____________________________________________   PHYSICAL EXAM:  VITAL SIGNS: ED Triage Vitals  Enc Vitals Group     BP 03/23/16 1945 (!) 157/75     Pulse Rate 03/23/16 1945 62     Resp 03/23/16 1945 16     Temp 03/23/16 1945 98.1 F (36.7 C)     Temp Source 03/23/16 1945 Oral      SpO2 03/23/16 1945 98 %     Weight 03/23/16 1946 190 lb (86.2 kg)     Height 03/23/16 1946 5\' 5"  (1.651 m)     Head Circumference --      Peak Flow --      Pain Score 03/23/16 1946 8     Pain Loc --      Pain Edu? --      Excl. in GC? --     Constitutional: Alert and oriented. Well appearing and in no acute distress. Eyes: Conjunctivae are normal. PERRL. EOMI. Head: AtraumaticExcept for palm sized lump on the right occiput of the head which is tender.. Nose: No congestion/rhinnorhea. Mouth/Throat: Mucous membranes are moist.  Oropharynx non-erythematous. Neck: No stridor.  Cardiovascular: Normal rate, regular rhythm. Grossly normal heart sounds.  Good peripheral circulation. Respiratory: Normal respiratory effort.  No retractions. Lungs  CTAB. Gastrointestinal: Soft and nontender. No distention. No abdominal bruits. No CVA tenderness. Musculoskeletal: No lower extremity tenderness nor edema.  No joint effusions. Neurologic:  Normal speech and language. No gross focal neurologic deficits are appreciated. Cranial nerves II through XII are intact cerebellar finger-nose and rapid alternating movements and hands are normal motor strength is 5 over 5 throughout and sensation is intact throughout. Patient does not have any inducible mice diagnosed with head movement. No gait instability. Skin:  Skin is warm, dry and intact. No rash noted.  ____________________________________________   LABS (all labs ordered are listed, but only abnormal results are displayed)  Labs Reviewed  COMPREHENSIVE METABOLIC PANEL - Abnormal; Notable for the following:       Result Value   Glucose, Bld 145 (*)    BUN 22 (*)    ALT 6 (*)    All other components within normal limits  TROPONIN I  CBC WITH DIFFERENTIAL/PLATELET  TROPONIN I   ____________________________________________  EKG  EKG read and interpreted by me shows a fully paced rhythm at a rate of 63 years atrioventricular pacer no apparent ST-T  changes ____________________________________________  RADIOLOGY  udy Result   CLINICAL DATA:  Fall with head injury.  Syncope.  Initial encounter.  EXAM: CT HEAD WITHOUT CONTRAST  CT CERVICAL SPINE WITHOUT CONTRAST  TECHNIQUE: Multidetector CT imaging of the head and cervical spine was performed following the standard protocol without intravenous contrast. Multiplanar CT image reconstructions of the cervical spine were also generated.  COMPARISON:  06/25/2015 head CT  FINDINGS: CT HEAD FINDINGS  Brain: No evidence of acute infarction, hemorrhage, hydrocephalus, extra-axial collection or mass lesion/mass effect.  Vascular: No hyperdense vessel or unexpected calcification.  Skull: Right occipital hematoma without underlying fracture.  Sinuses/Orbits: No acute finding  CT CERVICAL SPINE FINDINGS  Alignment: No traumatic malalignment  Skull base and vertebrae: Negative for fracture  Soft tissues and spinal canal: No prevertebral fluid or swelling. No visible canal hematoma.  Disc levels: Degenerative disc narrowing. No evidence of spinal stenosis.  Upper chest: Negative  IMPRESSION: 1. No evidence of intracranial or cervical spine injury. 2. Occipital scalp hematoma without fracture.   Electronically Signed   By: Marnee Spring M.D.   On: 03/23/2016 20:47    ____________________________________________   PROCEDURES  Procedure(s) performed:   Procedures  Critical Care performed:   ____________________________________________   INITIAL IMPRESSION / ASSESSMENT AND PLAN / ED COURSE  Pertinent labs & imaging results that were available during my care of the patient were reviewed by me and considered in my medical decision making (see chart for details).       ____________________________________________   FINAL CLINICAL IMPRESSION(S) / ED DIAGNOSES  Final diagnoses:  Fall, initial encounter      NEW MEDICATIONS  STARTED DURING THIS VISIT:  Discharge Medication List as of 03/23/2016 11:45 PM       Note:  This document was prepared using Dragon voice recognition software and may include unintentional dictation errors.    Arnaldo Natal, MD 03/24/16 [phone]

## 2016-03-23 NOTE — ED Notes (Signed)
Patient drinking water with no complaints of nausea at this time. Will continue to monitor.

## 2016-03-24 ENCOUNTER — Encounter: Payer: Self-pay | Admitting: Family

## 2016-03-24 ENCOUNTER — Ambulatory Visit (INDEPENDENT_AMBULATORY_CARE_PROVIDER_SITE_OTHER): Payer: Medicare HMO | Admitting: Family

## 2016-03-24 VITALS — BP 126/68 | HR 84 | Temp 98.1°F | Ht 65.0 in | Wt 192.6 lb

## 2016-03-24 DIAGNOSIS — I10 Essential (primary) hypertension: Secondary | ICD-10-CM | POA: Diagnosis not present

## 2016-03-24 DIAGNOSIS — I447 Left bundle-branch block, unspecified: Secondary | ICD-10-CM

## 2016-03-24 DIAGNOSIS — Z0000 Encounter for general adult medical examination without abnormal findings: Secondary | ICD-10-CM

## 2016-03-24 DIAGNOSIS — R42 Dizziness and giddiness: Secondary | ICD-10-CM | POA: Diagnosis not present

## 2016-03-24 LAB — LIPID PANEL
CHOL/HDL RATIO: 3
Cholesterol: 213 mg/dL — ABNORMAL HIGH (ref 0–200)
HDL: 64.2 mg/dL (ref >39.00)
LDL CALC: 126 mg/dL — AB (ref 0–99)
NONHDL: 148.48
Triglycerides: 111 mg/dL (ref 0.0–149.0)
VLDL: 22.2 mg/dL (ref 0.0–40.0)

## 2016-03-24 LAB — VITAMIN D 25 HYDROXY (VIT D DEFICIENCY, FRACTURES): VITD: 28.26 ng/mL — ABNORMAL LOW (ref 30.00–100.00)

## 2016-03-24 LAB — HEMOGLOBIN A1C: Hgb A1c MFr Bld: 5.7 % (ref 4.6–6.5)

## 2016-03-24 LAB — TSH: TSH: 1.38 u[IU]/mL (ref 0.35–4.50)

## 2016-03-24 NOTE — Assessment & Plan Note (Addendum)
UTD colonoscopy. Ordered mammogram and patient understands to schedule. Pap smear done at 71 years of age which was normal per patient. She has no history of gynecologic cancer or any complaints today. She declines further Pap smears or pelvic exam today based on age and preference. Screening labs ordered.

## 2016-03-24 NOTE — Assessment & Plan Note (Signed)
Stable  Continue current regimen  

## 2016-03-24 NOTE — Assessment & Plan Note (Signed)
Pacemaker; follows with Dr. Alberteen Spindleline.

## 2016-03-24 NOTE — Assessment & Plan Note (Signed)
Reassured by normal neurologic exam and work up from ED last night. HA improving. On meclizine. Will follow

## 2016-03-24 NOTE — Progress Notes (Signed)
Pre visit review using our clinic review tool, if applicable. No additional management support is needed unless otherwise documented below in the visit note. 

## 2016-03-24 NOTE — Patient Instructions (Signed)
We placed a referral. Mammogram this year. I asked that you call one the below locations and schedule this when it is convenient for you.   If you have dense breasts, you may ask for 3D mammogram over the traditional 2D mammogram as new evidence suggest 3D is superior. Please note that NOT all insurance companies cover 3D and you may have to pay a higher copay. You may call your insurance company to further clarify your benefits.   Options for [place]  [place], [place]  * [facility] [place] * Note if you ask for 3D mammogram at this location, you must request [place], [place] location*   Health Maintenance, Female Introduction Adopting a healthy lifestyle and getting preventive care can go a long way to promote health and wellness. Talk with your health care provider about what schedule of regular examinations is right for you. This is a good chance for you to check in with your provider about disease prevention and staying healthy. In between checkups, there are plenty of things you can do on your own. Experts have done a lot of research about which lifestyle changes and preventive measures are most likely to keep you healthy. Ask your health care provider for more information. Weight and diet Eat a healthy diet  Be sure to include plenty of vegetables, fruits, low-fat dairy products, and lean protein.  Do not eat a lot of foods high in solid fats, added sugars, or salt.  Get regular exercise. This is one of the most important things you can do for your health.  Most adults should exercise for at least 150 minutes each week. The exercise should increase your heart rate and make you sweat (moderate-intensity exercise).  Most adults should also do strengthening exercises at least twice a week. This is in addition to the  moderate-intensity exercise. Maintain a healthy weight  Body mass index (BMI) is a measurement that can be used to identify possible weight problems. It estimates body fat based on height and weight. Your health care provider can help determine your BMI and help you achieve or maintain a healthy weight.  For females 28 years of age and older:  A BMI below 18.5 is considered underweight.  A BMI of 18.5 to 24.9 is normal.  A BMI of 25 to 29.9 is considered overweight.  A BMI of 30 and above is considered obese. Watch levels of cholesterol and blood lipids  You should start having your blood tested for lipids and cholesterol at 69 years of age, then have this test every 5 years.  You may need to have your cholesterol levels checked more often if:  Your lipid or cholesterol levels are high.  You are older than 69 years of age.  You are at high risk for heart disease. Cancer screening Lung Cancer  Lung cancer screening is recommended for adults 27-82 years old who are at high risk for lung cancer because of a history of smoking.  A yearly low-dose CT scan of the lungs is recommended for people who:  Currently smoke.  Have quit within the past 15 years.  Have at least a 30-pack-year history of smoking. A pack year is smoking an average of one pack of cigarettes a day for 1 year.  Yearly screening should continue until it has been 15 years since you  quit.  Yearly screening should stop if you develop a health problem that would prevent you from having lung cancer treatment. Breast Cancer  Practice breast self-awareness. This means understanding how your breasts normally appear and feel.  It also means doing regular breast self-exams. Let your health care provider know about any changes, no matter how small.  If you are in your 20s or 30s, you should have a clinical breast exam (CBE) by a health care provider every 1-3 years as part of a regular health exam.  If you are 47 or  older, have a CBE every year. Also consider having a breast X-ray (mammogram) every year.  If you have a family history of breast cancer, talk to your health care provider about genetic screening.  If you are at high risk for breast cancer, talk to your health care provider about having an MRI and a mammogram every year.  Breast cancer gene (BRCA) assessment is recommended for women who have family members with BRCA-related cancers. BRCA-related cancers include:  Breast.  Ovarian.  Tubal.  Peritoneal cancers.  Results of the assessment will determine the need for genetic counseling and BRCA1 and BRCA2 testing. Cervical Cancer  Your health care provider may recommend that you be screened regularly for cancer of the pelvic organs (ovaries, uterus, and vagina). This screening involves a pelvic examination, including checking for microscopic changes to the surface of your cervix (Pap test). You may be encouraged to have this screening done every 3 years, beginning at age 56.  For women ages 79-65, health care providers may recommend pelvic exams and Pap testing every 3 years, or they may recommend the Pap and pelvic exam, combined with testing for human papilloma virus (HPV), every 5 years. Some types of HPV increase your risk of cervical cancer. Testing for HPV may also be done on women of any age with unclear Pap test results.  Other health care providers may not recommend any screening for nonpregnant women who are considered low risk for pelvic cancer and who do not have symptoms. Ask your health care provider if a screening pelvic exam is right for you.  If you have had past treatment for cervical cancer or a condition that could lead to cancer, you need Pap tests and screening for cancer for at least 20 years after your treatment. If Pap tests have been discontinued, your risk factors (such as having a new sexual partner) need to be reassessed to determine if screening should resume. Some  women have medical problems that increase the chance of getting cervical cancer. In these cases, your health care provider may recommend more frequent screening and Pap tests. Colorectal Cancer  This type of cancer can be detected and often prevented.  Routine colorectal cancer screening usually begins at 69 years of age and continues through 69 years of age.  Your health care provider may recommend screening at an earlier age if you have risk factors for colon cancer.  Your health care provider may also recommend using home test kits to check for hidden blood in the stool.  A small camera at the end of a tube can be used to examine your colon directly (sigmoidoscopy or colonoscopy). This is done to check for the earliest forms of colorectal cancer.  Routine screening usually begins at age 78.  Direct examination of the colon should be repeated every 5-10 years through 69 years of age. However, you may need to be screened more often if early forms of precancerous  polyps or small growths are found. Skin Cancer  Check your skin from head to toe regularly.  Tell your health care provider about any new moles or changes in moles, especially if there is a change in a mole's shape or color.  Also tell your health care provider if you have a mole that is larger than the size of a pencil eraser.  Always use sunscreen. Apply sunscreen liberally and repeatedly throughout the day.  Protect yourself by wearing long sleeves, pants, a wide-brimmed hat, and sunglasses whenever you are outside. Heart disease, diabetes, and high blood pressure  High blood pressure causes heart disease and increases the risk of stroke. High blood pressure is more likely to develop in:  People who have blood pressure in the high end of the normal range (130-139/85-89 mm Hg).  People who are overweight or obese.  People who are African American.  If you are 7-9 years of age, have your blood pressure checked every  3-5 years. If you are 54 years of age or older, have your blood pressure checked every year. You should have your blood pressure measured twice-once when you are at a hospital or clinic, and once when you are not at a hospital or clinic. Record the average of the two measurements. To check your blood pressure when you are not at a hospital or clinic, you can use:  An automated blood pressure machine at a pharmacy.  A home blood pressure monitor.  If you are between 74 years and 11 years old, ask your health care provider if you should take aspirin to prevent strokes.  Have regular diabetes screenings. This involves taking a blood sample to check your fasting blood sugar level.  If you are at a normal weight and have a low risk for diabetes, have this test once every three years after 69 years of age.  If you are overweight and have a high risk for diabetes, consider being tested at a younger age or more often. Preventing infection Hepatitis B  If you have a higher risk for hepatitis B, you should be screened for this virus. You are considered at high risk for hepatitis B if:  You were born in a country where hepatitis B is common. Ask your health care provider which countries are considered high risk.  Your parents were born in a high-risk country, and you have not been immunized against hepatitis B (hepatitis B vaccine).  You have HIV or AIDS.  You use needles to inject street drugs.  You live with someone who has hepatitis B.  You have had sex with someone who has hepatitis B.  You get hemodialysis treatment.  You take certain medicines for conditions, including cancer, organ transplantation, and autoimmune conditions. Hepatitis C  Blood testing is recommended for:  Everyone born from [birth year] through 1963.  Anyone with known risk factors for hepatitis C. Sexually transmitted infections (STIs)  You should be screened for sexually transmitted infections (STIs) including  gonorrhea and chlamydia if:  You are sexually active and are younger than 68 years of age.  You are older than 69 years of age and your health care provider tells you that you are at risk for this type of infection.  Your sexual activity has changed since you were last screened and you are at an increased risk for chlamydia or gonorrhea. Ask your health care provider if you are at risk.  If you do not have HIV, but are at risk, it may be  recommended that you take a prescription medicine daily to prevent HIV infection. This is called pre-exposure prophylaxis (PrEP). You are considered at risk if:  You are sexually active and do not regularly use condoms or know the HIV status of your partner(s).  You take drugs by injection.  You are sexually active with a partner who has HIV. Talk with your health care provider about whether you are at high risk of being infected with HIV. If you choose to begin PrEP, you should first be tested for HIV. You should then be tested every 3 months for as long as you are taking PrEP. Pregnancy  If you are premenopausal and you may become pregnant, ask your health care provider about preconception counseling.  If you may become pregnant, take 400 to 800 micrograms (mcg) of folic acid every day.  If you want to prevent pregnancy, talk to your health care provider about birth control (contraception). Osteoporosis and menopause  Osteoporosis is a disease in which the bones lose minerals and strength with aging. This can result in serious bone fractures. Your risk for osteoporosis can be identified using a bone density scan.  If you are 61 years of age or older, or if you are at risk for osteoporosis and fractures, ask your health care provider if you should be screened.  Ask your health care provider whether you should take a calcium or vitamin D supplement to lower your risk for osteoporosis.  Menopause may have certain physical symptoms and risks.  Hormone  replacement therapy may reduce some of these symptoms and risks. Talk to your health care provider about whether hormone replacement therapy is right for you. Follow these instructions at home:  Schedule regular health, dental, and eye exams.  Stay current with your immunizations.  Do not use any tobacco products including cigarettes, chewing tobacco, or electronic cigarettes.  If you are pregnant, do not drink alcohol.  If you are breastfeeding, limit how much and how often you drink alcohol.  Limit alcohol intake to no more than 1 drink per day for nonpregnant women. One drink equals 12 ounces of beer, 5 ounces of wine, or 1 ounces of hard liquor.  Do not use street drugs.  Do not share needles.  Ask your health care provider for help if you need support or information about quitting drugs.  Tell your health care provider if you often feel depressed.  Tell your health care provider if you have ever been abused or do not feel safe at home. This information is not intended to replace advice given to you by your health care provider. Make sure you discuss any questions you have with your health care provider. Document Released: 08/19/2010 Document Revised: 07/12/2015 Document Reviewed: 11/07/2014  2017 Elsevier

## 2016-03-25 ENCOUNTER — Emergency Department
Admission: EM | Admit: 2016-03-25 | Discharge: 2016-04-17 | Disposition: E | Payer: Medicare HMO | Attending: Emergency Medicine | Admitting: Emergency Medicine

## 2016-03-25 DIAGNOSIS — I10 Essential (primary) hypertension: Secondary | ICD-10-CM | POA: Insufficient documentation

## 2016-03-25 DIAGNOSIS — Z950 Presence of cardiac pacemaker: Secondary | ICD-10-CM | POA: Diagnosis not present

## 2016-03-25 DIAGNOSIS — E039 Hypothyroidism, unspecified: Secondary | ICD-10-CM | POA: Diagnosis not present

## 2016-03-25 DIAGNOSIS — Z7982 Long term (current) use of aspirin: Secondary | ICD-10-CM | POA: Insufficient documentation

## 2016-03-25 DIAGNOSIS — I469 Cardiac arrest, cause unspecified: Secondary | ICD-10-CM | POA: Insufficient documentation

## 2016-03-25 DIAGNOSIS — Z79899 Other long term (current) drug therapy: Secondary | ICD-10-CM | POA: Insufficient documentation

## 2016-03-25 DIAGNOSIS — G20 Parkinson's disease: Secondary | ICD-10-CM | POA: Insufficient documentation

## 2016-03-25 MED ORDER — EPINEPHRINE PF 1 MG/10ML IJ SOSY
PREFILLED_SYRINGE | INTRAMUSCULAR | Status: DC | PRN
  Administered 2016-03-25 (×2): 1 via INTRAVENOUS

## 2016-03-26 MED FILL — Medication: Qty: 1 | Status: AC

## 2016-04-17 DIAGNOSIS — 419620001 Death: Secondary | SNOMED CT | POA: Diagnosis not present

## 2016-04-17 NOTE — ED Provider Notes (Signed)
[facility] Emergency Department Provider Note  ____________________________________________   First MD Initiated Contact with Patient 03/29/16 0158     (approximate)  I have reviewed the triage vital signs and the nursing notes.   HISTORY  Chief Complaint Cardiac Arrest  Level 5 caveat:  patient unresponsive and critically ill  HPI Christina [name] is a 71 y.o. female who arrives in cardiac arrest by EMS.  They were called out because the patient seemed to be in her normal state of health yesterday and even went to her regular doctor for a follow-up appointment (see below).  She had a normal meal but then her husband woke up and saw that she was "thrashing around" almost as if she was having a seizure.  She then collapsed.  When the first responders arrived they did not find a pulse and began CPR.  When the paramedics arrived they were unable to establish an airway due to an unknown obstruction but the patient was able to be ventilated with a BVM without difficulty.  She was in what they felt was "a fine v-fib" and administered 3 defibrillations in additional to chest compressions and epinepherine x 9 according to ACLS protocols.  They called me and discussed the situation and I did encourage them to come to the ED particularly given the difficulty with the patient's airway.  When she arrived she was receiving automated chest compressions and BVM ventilation.  She had copious emesis which I suctioned.  After transferring her to the bed we did a rhythm check and she was in asystole.  See the hospital course for more details   Past Medical History:  Diagnosis Date  . Diverticula of intestine   . Essential hypertension   . Hypothyroidism    hypothyroidism  . Parkinson's disease (HCC)   . Sinus infection   . Vertigo     Patient Active Problem List   Diagnosis Date Noted  . LBBB (left bundle branch block) 10/02/2015  . Sinus arrest 07/02/2015  .  Bradycardia 06/25/2015  . Syncope and collapse   . Essential hypertension   . Urinary, incontinence, stress female 03/12/2015  . Edema 08/02/2014  . Bilateral leg numbness 05/15/2014  . Left sciatic nerve pain 04/13/2014  . Diverticulitis of colon 01/16/2014  . Medicare annual wellness visit, subsequent 07/08/2013  . Insomnia 01/31/2013  . PD (Parkinson's disease) (HCC) 01/05/2013  . Anemia 12/31/2012  . Routine physical examination 06/24/2012  . Hypothyroidism 07/02/2011  . Hypertension 07/02/2011  . Vertigo 12/23/2010    Past Surgical History:  Procedure Laterality Date  . EP IMPLANTABLE DEVICE N/A 07/02/2015   Procedure: Pacemaker Implant;  Surgeon: Duke SalviaSteven C Klein, MD;  [facility];  Service: Cardiovascular;  Laterality: N/A;  . GALLBLADDER SURGERY  2005  . THYROIDECTOMY  1976  . TUBAL LIGATION  1988  . VAGINAL DELIVERY      Prior to Admission medications   Medication Sig Start Date End Date Taking? Authorizing Provider  Alpha-Lipoic Acid 200 MG CAPS Take 200 mg by mouth 3 (three) times daily.     Historical Provider, MD  aspirin EC 81 MG EC tablet Take 1 tablet (81 mg total) by mouth daily. 06/26/15   Shaune PollackQing Chen, MD  calcium carbonate (OS-CAL) 600 MG TABS Take 600 mg by mouth daily.      Historical Provider, MD  carbidopa-levodopa (SINEMET IR) 25-100 MG tablet Take 2 tablets by mouth 3 (three) times daily.  12/28/14 12/28/15  Historical  Provider, MD  dextromethorphan-guaiFENesin [facility] DM) 30-600 MG 12hr tablet Take 1 tablet by mouth 2 (two) times daily as needed for cough (taking daily). 12/26/15   Allegra Grana, FNP  fexofenadine (ALLEGRA) 180 MG tablet Take 1 tablet (180 mg total) by mouth daily. Patient taking differently: Take 180 mg by mouth daily as needed.  06/24/12 10/02/15  Shelia Media, MD  levothyroxine (SYNTHROID, LEVOTHROID) 125 MCG tablet Take 1 tablet (125 mcg total) by mouth daily. 03/12/15   Shelia Media, MD  Lifitegrast Benay Spice) 5 %  SOLN Place 1 drop into both eyes 2 (two) times daily.    Historical Provider, MD  losartan-hydrochlorothiazide (HYZAAR) 50-12.5 MG tablet TAKE ONE TABLET BY MOUTH ONCE DAILY 06/04/15   Shelia Media, MD  Multiple Vitamin (MULTIVITAMIN) tablet Take 1 tablet by mouth daily. Reported on 03/12/2015    Historical Provider, MD  Omega 3 1200 MG CAPS Take 1 capsule by mouth daily.    Historical Provider, MD  pramipexole (MIRAPEX) 0.25 MG tablet Take 0.125-0.25 mg by mouth daily. Pt. Takes 0.5 tablet (0.125mg ) in the morning, 0.5 tablet (0.125mg ) at lunch, then takes a whole tablet (0.25mg ) at bedtime. 02/16/14 10/02/15  Historical Provider, MD  solifenacin (VESICARE) 5 MG tablet Take 5 mg by mouth daily.    Historical Provider, MD  traZODone (DESYREL) 100 MG tablet Take 100 mg by mouth at bedtime.  12/28/14 12/23/15  Historical Provider, MD    Allergies Lisinopril-hydrochlorothiazide and Penicillins  Family History  Problem Relation Age of Onset  . Lung cancer Mother   . Diabetes Mother   . Hypertension Mother   . Cancer Mother     lung  . Lung cancer Father   . Cancer Father     lung  . Bradycardia Father   . Diabetes Maternal Grandmother     Social History Social History  Substance Use Topics  . Smoking status: Never Smoker  . Smokeless tobacco: Never Used  . Alcohol use 0.0 oz/week     Comment: occasional/rare glass of wine.    Review of Systems  Level 5 caveat:  history/ROS limited by acute/critical illness ____________________________________________   PHYSICAL EXAM:  VITAL SIGNS: ED Triage Vitals  Enc Vitals Group     BP      Pulse      Resp      Temp      Temp src      SpO2      Weight      Height      Head Circumference      Peak Flow      Pain Score      Pain Loc      Pain Edu?      Excl. in GC?     Constitutional: Unresponsive, asystole upon arrival upon pulse check (CPR in progress upon arrival) Eyes: Pupils fixed and dilated with no corneal  reflex Mouth/Throat: Copious brown emesis after CPR and bag valve mask ventilations.  Her airway was abnormal; her anatomy demonstrated no clear epiglottis and what appeared to be a fleshy mass of tissue where I would expect to see the epiglottis and vocal cords.   Neck: Old well-healed surgical scar on the outside of her neck. Cardiovascular: Pulseless.  Asystole on monitor. Respiratory: No difficulty with BVM respirations.  No spontaneous respirations Gastrointestinal: Distended after nearly one hour of CPR Musculoskeletal: No gross deformities of extremities. Neurologic:  GCS 3 Skin:  Pale, cool, cyanotic extremities and perioral  ____________________________________________   LABS (all labs ordered are listed, but only abnormal results are displayed)  Labs Reviewed - No data to display ____________________________________________  EKG  Asystole on monitor ____________________________________________  RADIOLOGY   No results found.  ____________________________________________   PROCEDURES  Procedure(s) performed:   .Critical Care Performed by: Loleta Rose Authorized by: Loleta Rose   Critical care provider statement:    Critical care time (minutes):  90   Critical care time was exclusive of:  Separately billable procedures and treating other patients   Critical care was necessary to treat or prevent imminent or life-threatening deterioration of the following conditions:  Cardiac failure   Critical care was time spent personally by me on the following activities:  Development of treatment plan with patient or surrogate, discussions with consultants, evaluation of patient's response to treatment, examination of patient, obtaining history from patient or surrogate, ordering and performing treatments and interventions, ordering and review of laboratory studies, ordering and review of radiographic studies, pulse oximetry, re-evaluation of patient's condition and review  of old charts      Critical Care performed: Yes, see critical care procedure note(s) ____________________________________________   INITIAL IMPRESSION / ASSESSMENT AND PLAN / ED COURSE  Pertinent labs & imaging results that were available during my care of the patient were reviewed by me and considered in my medical decision making (see chart for details).  The patient was in asystole upon arrival.  I continued CPR per ACLS recommendations while I attempted to establish an airway.  I was unsuccessful in doing so due to what appeared to be possibly a tumor visualized well on glides go.  I attempted kaleidoscope intubation and then attempted to use a bougie to get past the obstruction, but the bougie kept passing into the esophagus.  However she was able to be ventilated with BVM without difficulty and she had already been down for approximately 1 hour with no return of pulse.  There was no cardiac activity and I called the code as per nursing notes .  Family is gathering and I will speak with him shortly.   Clinical Course as of Mar 25 724  Tue 04/06/2016  0220 I discussed the case with the patient's husband and adult children.  They were appropriately upset and provided some of the background information listed above.  Based on the description that the patient was "flailing around" and then collapsed it sounds as if she had possibly a respiratory arrest followed by cardiac arrest, but this is pierced speculation.  She had no history of a tumor in her throat; she had thyroid surgery about 40 years ago but had no current knowledge of a tumor although the family did state that she mentioned recently she felt like she was having some difficulty swallowing.  As of right now the family does believe that they would like this to become a medical examiner case which I feel is appropriate, but they may change their mind over the next little bit while they consider it.  Her primary care doctor is with  [facility] and my staff will contact [employer] to fill out the death certificate versus the medical examiner.  [CF]  [phone] I have had at least 4 separate conversations with the patient's daughter and husband.  They were uncertain about whether or not to pursue a medical examiner case, but I expressed that it may be appropriate under the circumstances.  I called and spoke with the medical examiner, Dr. Algie Coffer, who agreed with  me and said that if we have the patient's body taken to the morgue, he will call [facility] tomorrow to discuss the case and likely proceed with the autopsy. I updated the family who agree with the plan.  [CF]    Clinical Course User Index [CF] Loleta Rose, MD    ____________________________________________  FINAL CLINICAL IMPRESSION(S) / ED DIAGNOSES  Final diagnoses:  Cardiopulmonary arrest (HCC)     MEDICATIONS GIVEN DURING THIS VISIT:  Medications  EPINEPHrine (ADRENALIN) 1 MG/10ML injection (1 Syringe Intravenous Given 04/20/2016 0158)     NEW OUTPATIENT MEDICATIONS STARTED DURING THIS VISIT:  Discharge Medication List as of 04/20/16  5:10 AM      Discharge Medication List as of April 20, 2016  5:10 AM      Discharge Medication List as of 04-20-2016  5:10 AM       Note:  This document was prepared using Dragon voice recognition software and may include unintentional dictation errors.    Loleta Rose, MD 2016-04-20 [phone]

## 2016-04-17 NOTE — ED Notes (Signed)
Family and EDP discussing merits of having ME perform an autopsy.

## 2016-04-17 NOTE — Progress Notes (Signed)
Chaplain was called to provide an emotional and spiritual support to the family of the patient in room 5 as the medical team was trying to revive the patient. Chaplain prayed with family members and provided water to drink. A few minutes later, a Physician came in the family room and told the family that unfortunately, the medical team could not resuscitate their loved one. Dr. Expressed his deep condolences to the family. Chaplain spend sometimes with family and left them to respond to a rapid response that came in while he was with the grieving family.

## 2016-04-17 NOTE — ED Triage Notes (Signed)
Pt arrives w/ Christina [name] in operation, BVM ventilations per EMS. Pt w/ vomiting PTA. LAC PIV # 18

## 2016-04-17 NOTE — ED Notes (Addendum)
CPR on-going after removing [place]. NT's performing CPR, stopped CPR x 1 at this time for rhythm check, CPR resumed until time of death at 0201

## 2016-04-17 DEATH — deceased

## 2016-07-29 ENCOUNTER — Telehealth: Payer: Self-pay | Admitting: Family

## 2016-07-29 NOTE — Telephone Encounter (Signed)
Patients husband has requested to have a scheduled time to visit with Christina [name] , he stated that he has a few questions .

## 2016-07-29 NOTE — Telephone Encounter (Signed)
Please advise 

## 2016-07-29 NOTE — Telephone Encounter (Signed)
Patient spouse called and wished to speak with you in regards to patient's care. He wanted to know if there was anything that might have been going on that the patient did not divulge to him. Please advise, thank you!  Call John @ [phone]

## 2016-07-30 NOTE — Telephone Encounter (Signed)
Patients husband is coming in to speak with margaret.

## 2016-07-30 NOTE — Telephone Encounter (Signed)
Christina Christina [name] called back to follow up on receiving a return call from Christina [name].   Call Christina [name] @ [phone] or cell [phone]. Thank you!

## 2016-07-30 NOTE — Telephone Encounter (Signed)
Patient has been declared deceased. Please advise.

## 2016-07-30 NOTE — Telephone Encounter (Signed)
Please advise 

## 2016-08-06 ENCOUNTER — Telehealth: Payer: Self-pay | Admitting: Internal Medicine

## 2016-08-06 NOTE — Telephone Encounter (Signed)
Pt's husband was seen in by Dr. Mariah MillingGollan yesterday. He states that Dr. Graciela HusbandsKlein was going to call the Medical Examiner's office regarding pt's death certificate and pt's pacer. He asked Dr. Mariah MillingGollan to call, but pt was not seen by him.

## 2016-08-06 NOTE — Telephone Encounter (Signed)
New message   Pt daughter calling because she has some some things she would like to discuss about her mothers death. Requests a call back.

## 2016-08-07 NOTE — Telephone Encounter (Signed)
Will forward to Dr. Klein to review. 

## 2017-08-29 IMAGING — MR MR HEAD W/O CM
9 of 10 series · 40 of 48 positions shown · non-contrast
Comparison: Head and cervical spine CT 06/25/2015. Brain MRI
03/20/2007.

CLINICAL DATA: 70-year-old female with syncopal episode,
intermittent vertigo. Bradycardia on arrival. Initial encounter.

EXAM:
MRI HEAD WITHOUT CONTRAST
TECHNIQUE: Multiplanar, multiecho pulse sequences of the brain and surrounding
structures were obtained without intravenous contrast.

[Series 2: T1 · sagittal · 5.0mm · 0.45mm/px · 3 of 29 slices shown]
[im 1/29]
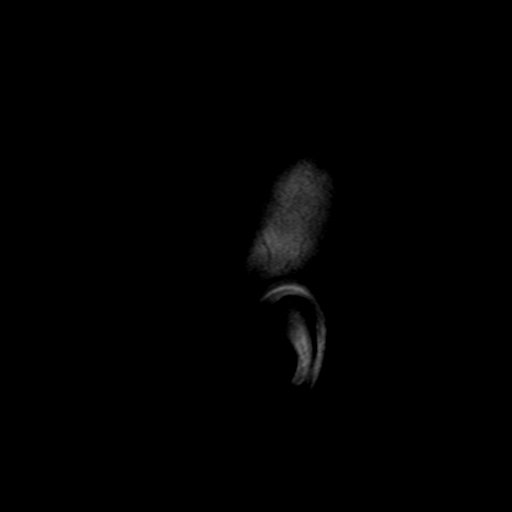
[im 15/29]
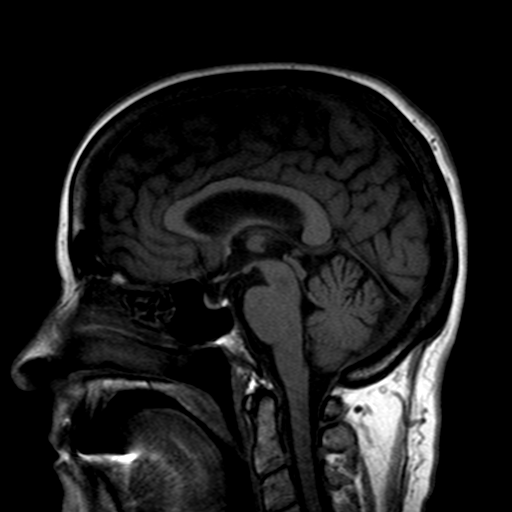
[im 29/29]
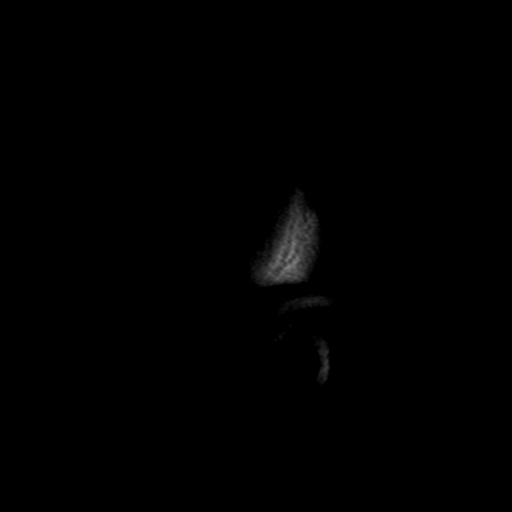

[Series 4: DWI · axial · 4.0mm · 0.94mm/px · z∈[-71,+92]mm · 5 of 42 slices shown (1 of 4)]
[im 1/42]
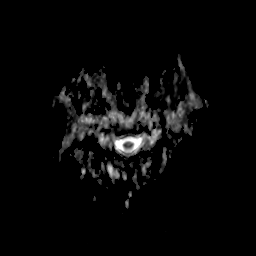
[im 11/42]
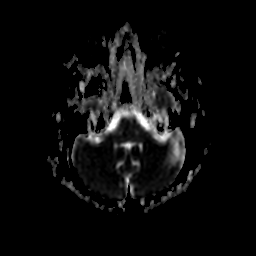
[im 21/42]
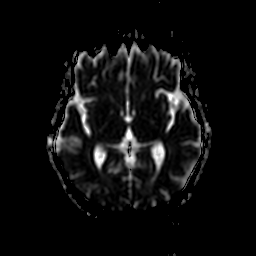
[im 31/42]
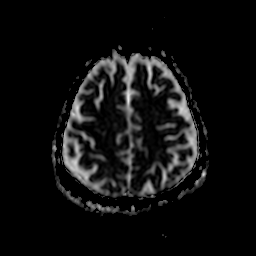
[im 42/42]
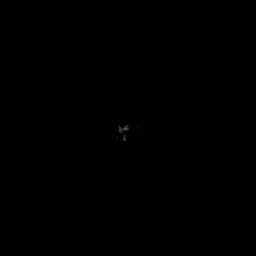

[Series 6: DWI · coronal · 5.0mm · 1.80mm/px · 5 of 38 slices shown (2 of 4)]
[im 1/38]
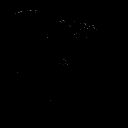
[im 10/38]
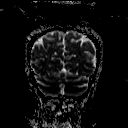
[im 19/38]
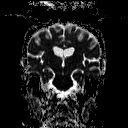
[im 28/38]
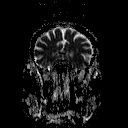
[im 38/38]
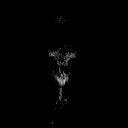

[Series 7: DWI · axial · 4.0mm · 0.94mm/px · z∈[-71,+88]mm · 6 of 41 slices shown (3 of 4)]
[im 1/41]
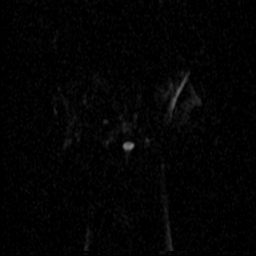
[im 9/41]
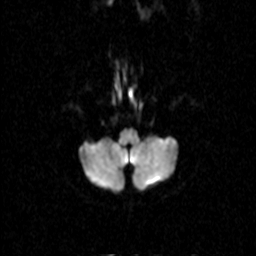
[im 17/41]
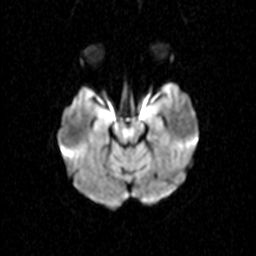
[im 25/41]
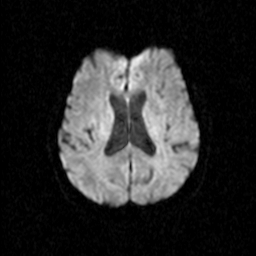
[im 33/41]
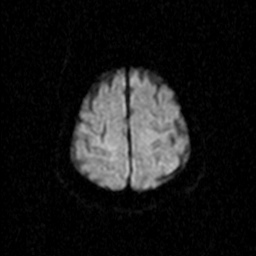
[im 41/41]
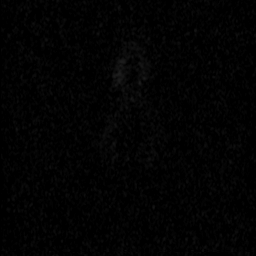

[Series 8: T2 · axial · 5.0mm · 0.45mm/px · z∈[-71,+91]mm · 4 of 26 slices shown (1 of 3)]
[im 1/26]
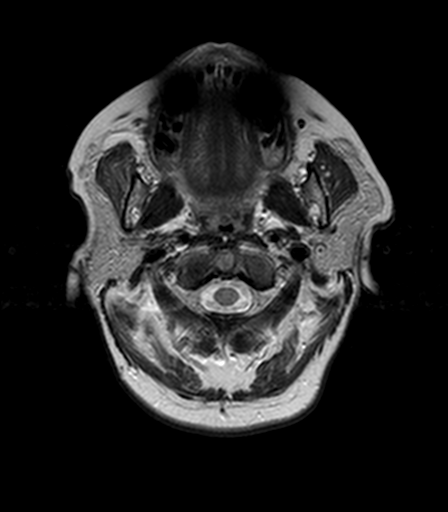
[im 9/26]
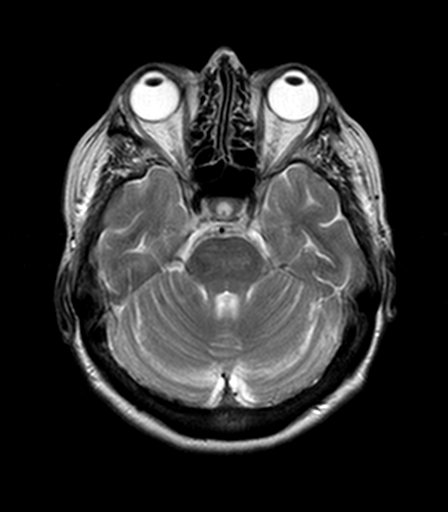
[im 17/26]
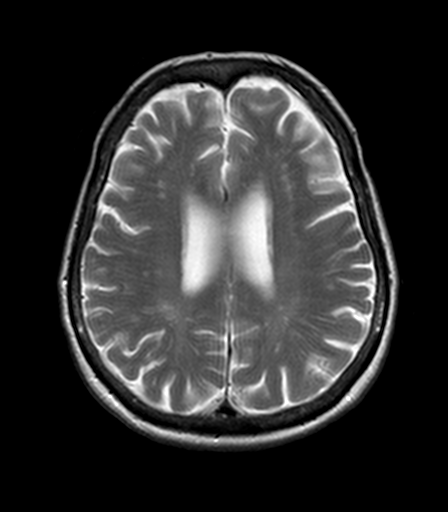
[im 26/26]
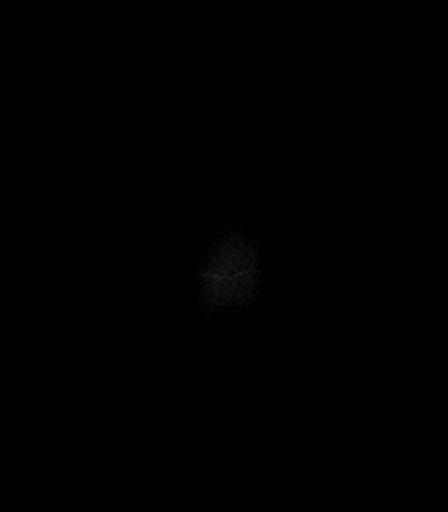

[Series 9: DWI · coronal · 5.0mm · 1.80mm/px · 5 of 36 slices shown (4 of 4)]
[im 1/36]
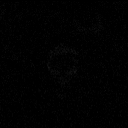
[im 9/36]
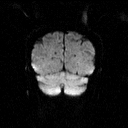
[im 18/36]
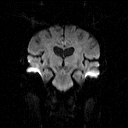
[im 27/36]
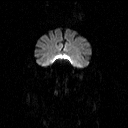
[im 36/36]
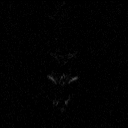

[Series 10: FLAIR · axial · 5.0mm · 0.90mm/px · z∈[-71,+91]mm · 4 of 26 slices shown]
[im 1/26]
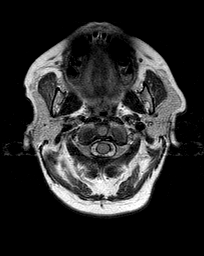
[im 9/26]
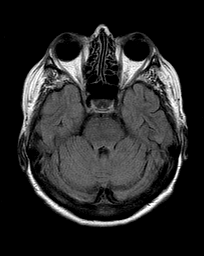
[im 17/26]
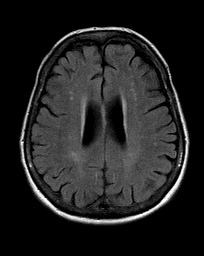
[im 26/26]
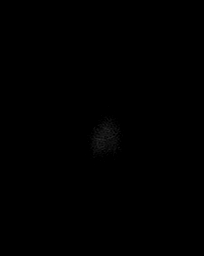

[Series 11: T2 · axial · 5.0mm · 0.45mm/px · z∈[-71,+91]mm · 4 of 26 slices shown (2 of 3)]
[im 1/26]
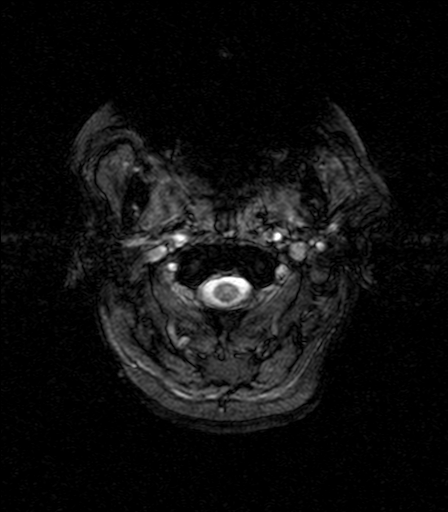
[im 9/26]
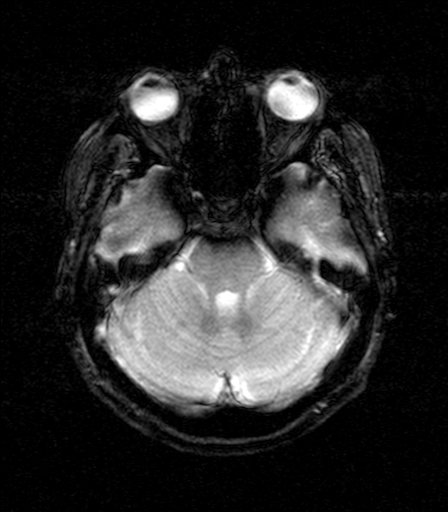
[im 17/26]
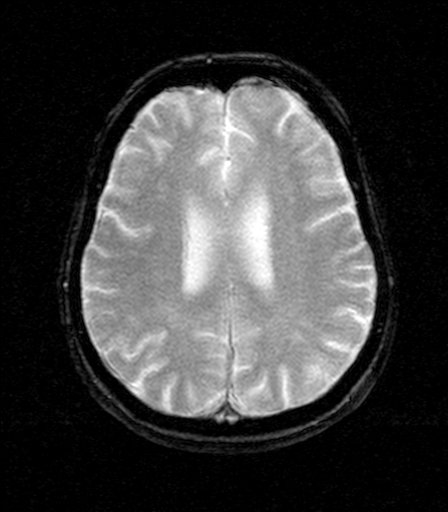
[im 26/26]
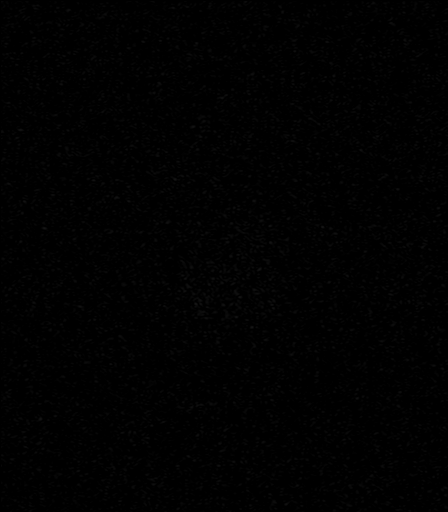

[Series 13: T2 · coronal · 5.0mm · 0.45mm/px · 4 of 29 slices shown (3 of 3)]
[im 1/29]
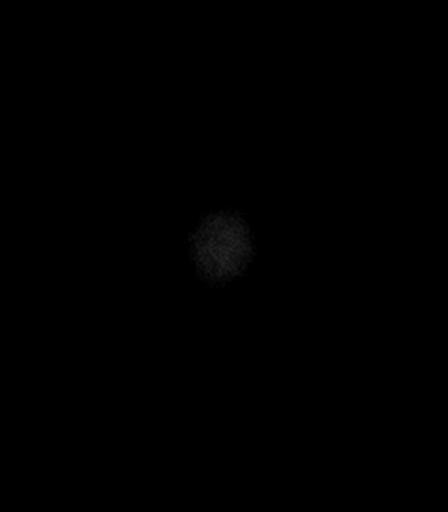
[im 10/29]
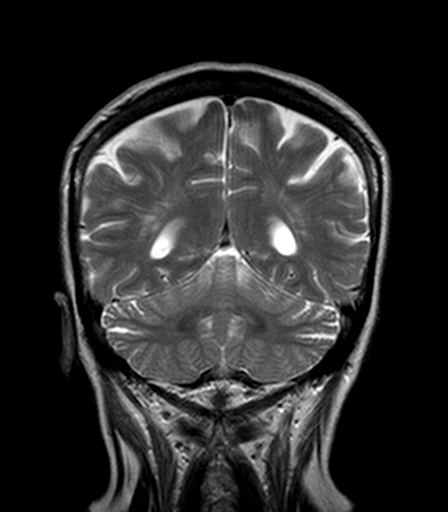
[im 19/29]
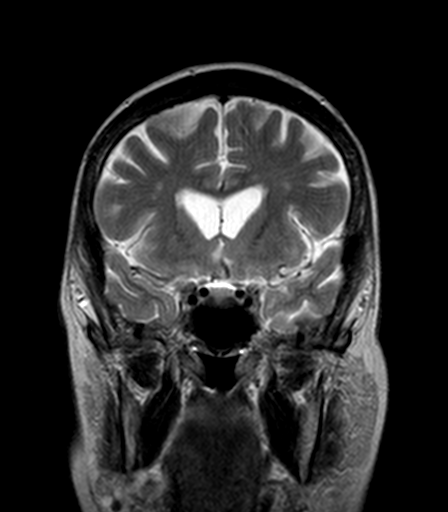
[im 29/29]
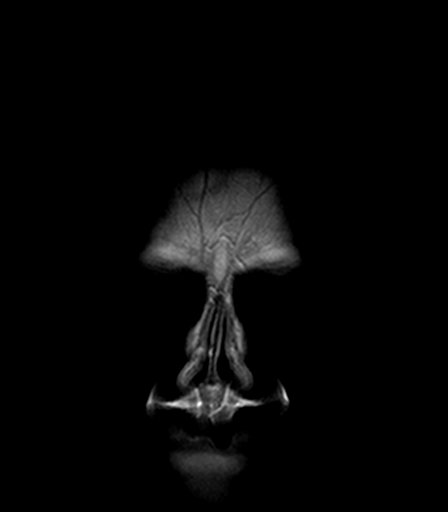

[40 of 48 positions shown; findings below may reference images not displayed]

FINDINGS: Major intracranial vascular flow voids are within normal limits,
dominant appearing distal right vertebral artery. Cerebral volume is
within normal limits for age. No restricted diffusion to suggest
acute infarction. No midline shift, mass effect, evidence of mass
lesion, ventriculomegaly, extra-axial collection or acute
intracranial hemorrhage. Cervicomedullary junction and pituitary are
within normal limits.

Mild for age scattered nonspecific cerebral white matter T2 and
FLAIR hyperintense foci, with mild progression since 9339. No
cortical encephalomalacia or chronic cerebral blood products. Deep
gray matter nuclei, brainstem, and cerebellum are normal for age.
Visible internal auditory structures appear normal.

Mastoids are clear. Trace paranasal sinus mucosal thickening.
Negative orbit and scalp soft tissues. Negative visualized cervical
spine. Visualized bone marrow signal is within normal limits.
IMPRESSION: 1.  No acute intracranial abnormality.
2. Mild for age nonspecific cerebral white matter signal changes
with mild progression since 9339. Most commonly this reflects
chronic small vessel disease.

## 2017-09-05 IMAGING — DX DG CHEST 2V
2 series · 2 of 2 positions shown · non-contrast
Comparison: Portable chest x-ray June 25, 2015

CLINICAL DATA: Interval placement of permanent pacemaker.

EXAM:
CHEST  2 VIEW

[chest pa]
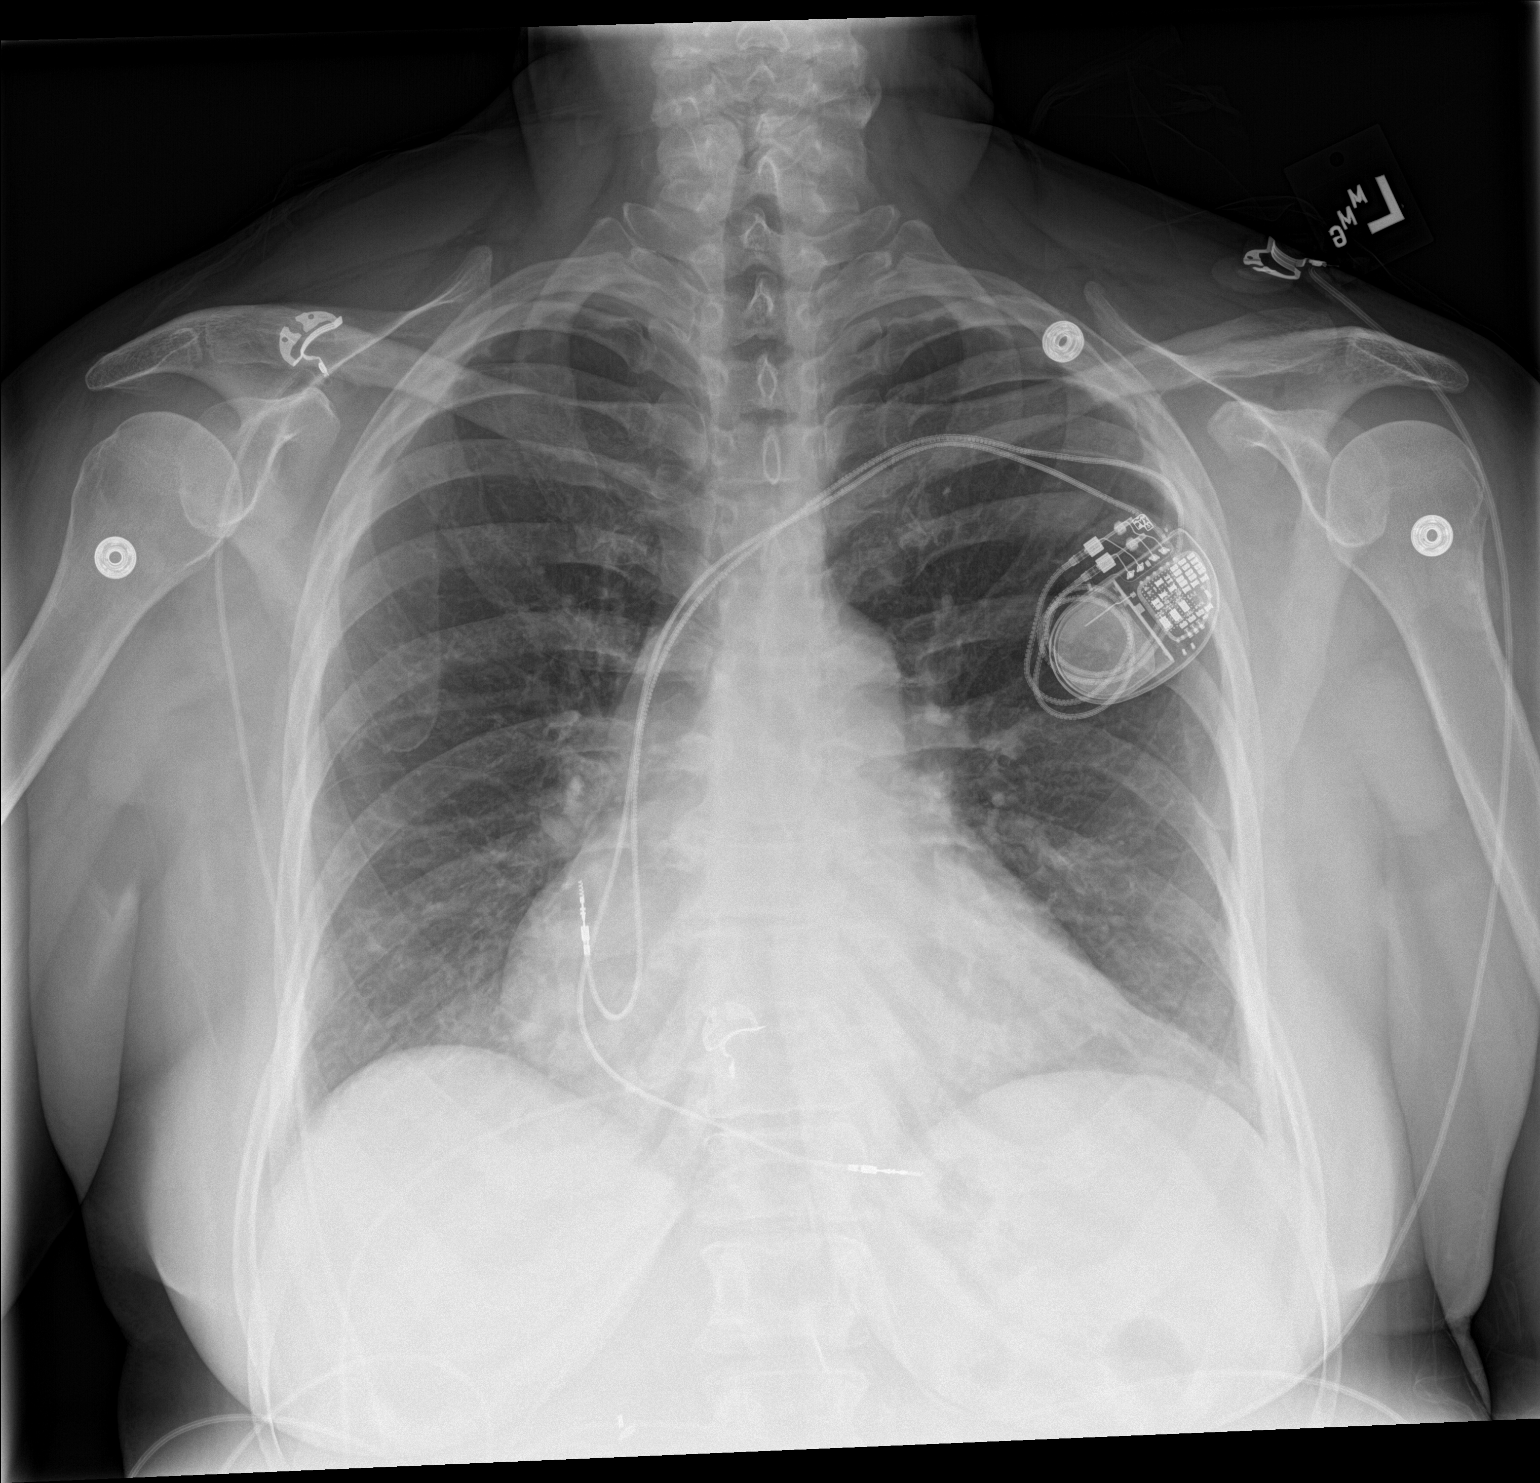

[chest lat]
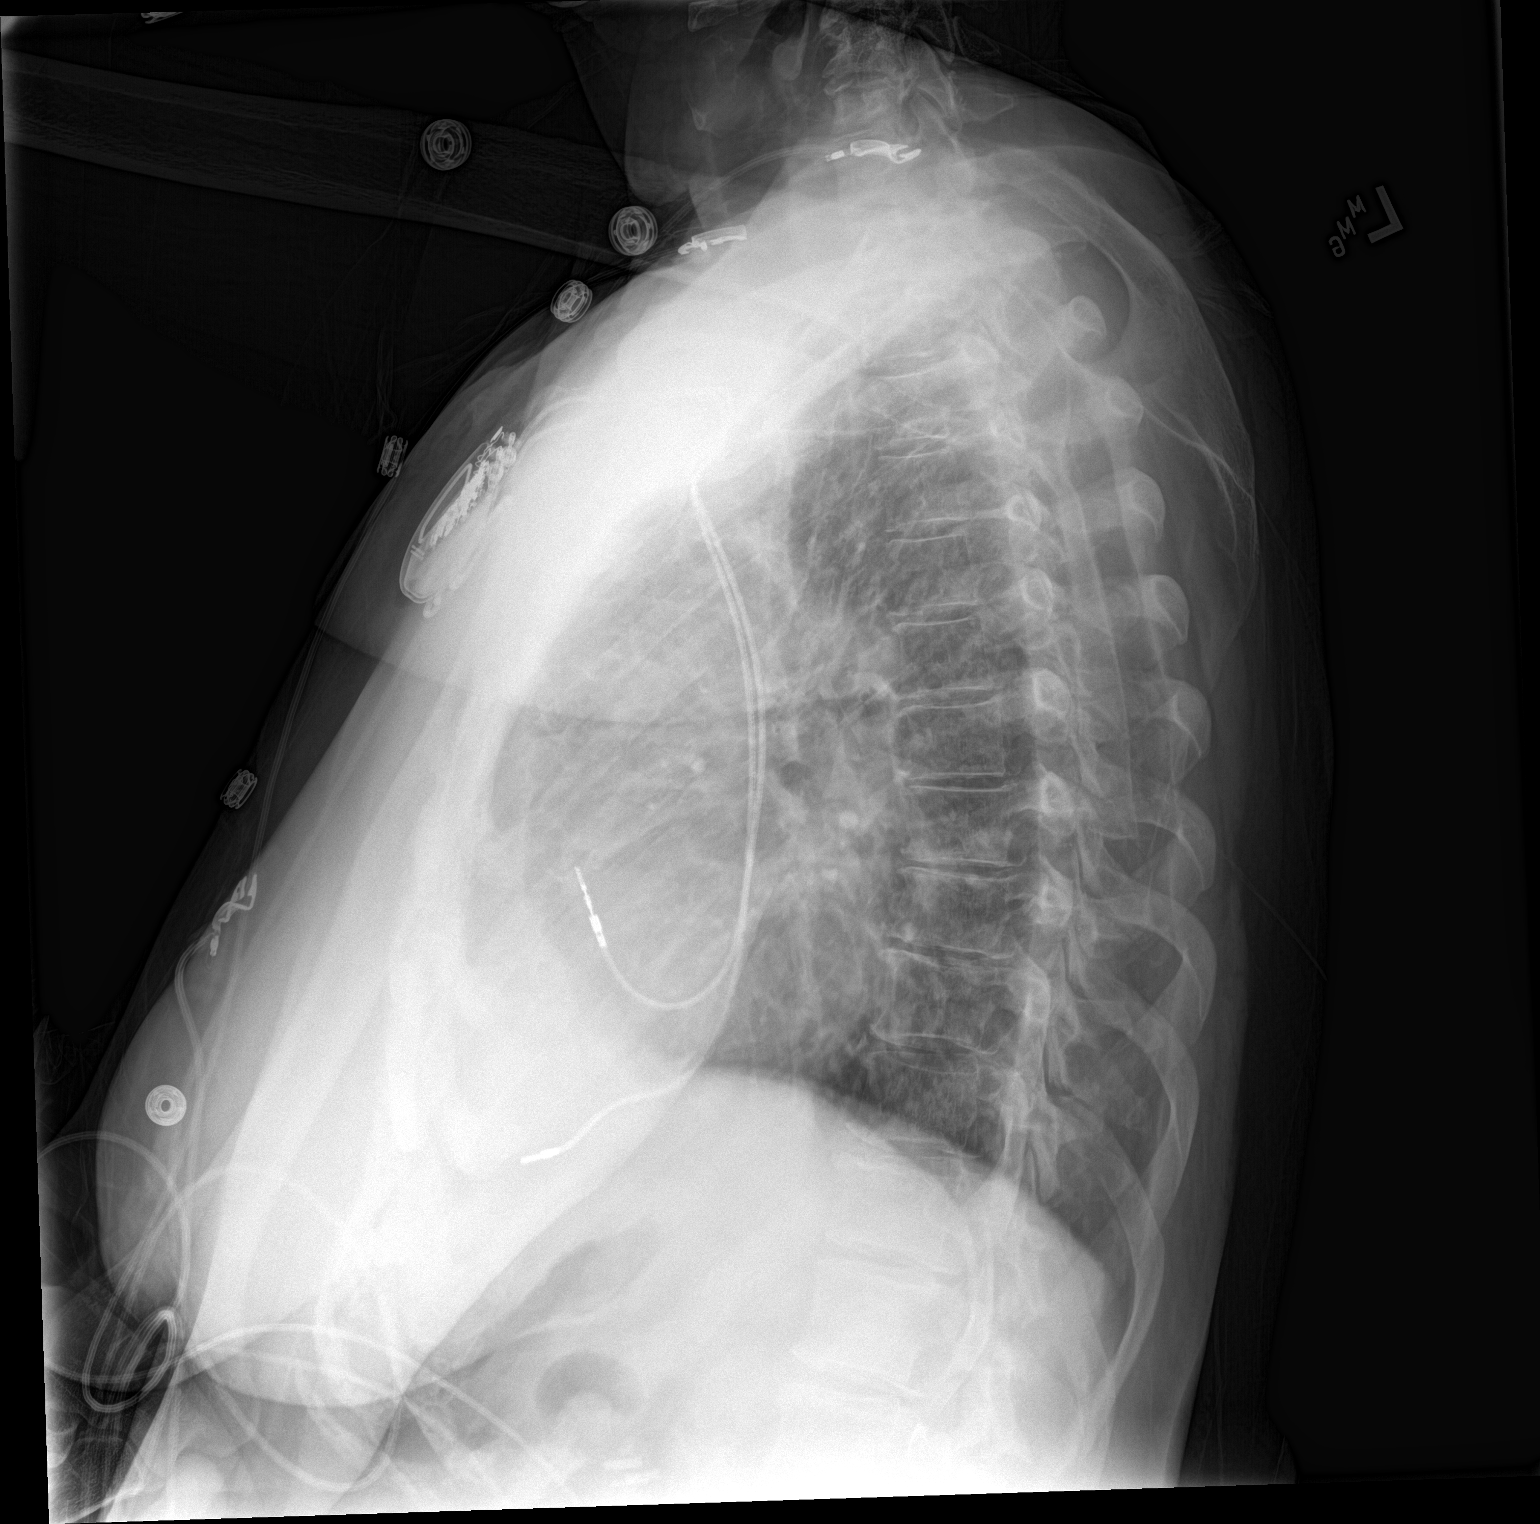

[2 of 2 positions shown; findings below may reference images not displayed]

FINDINGS: The lungs are adequately inflated. There is no pneumothorax, pleural
effusion, or alveolar infiltrate. The cardiac silhouette is enlarged
but stable. The pulmonary vascularity is not engorged. The
mediastinum is normal in width. The trachea is midline. The bony
thorax exhibits no acute abnormality. The permanent pacemaker and
generator are in appropriate position radiographically.
IMPRESSION: There is no postprocedure complication following permanent pacemaker
placement.
